# Patient Record
Sex: Female | Born: 1975 | Race: White | Hispanic: No | Marital: Single | Smoking: Current some day smoker
Health system: Southern US, Community
[De-identification: ages and names within clinical notes are randomized; demographics above are authoritative.]

## PROBLEM LIST (undated history)

## (undated) DIAGNOSIS — C801 Malignant (primary) neoplasm, unspecified: Secondary | ICD-10-CM

## (undated) DIAGNOSIS — K5792 Diverticulitis of intestine, part unspecified, without perforation or abscess without bleeding: Secondary | ICD-10-CM

---

## 2004-03-06 ENCOUNTER — Encounter: Admission: RE | Admit: 2004-03-06 | Discharge: 2004-03-06 | Payer: Self-pay | Admitting: Family Medicine

## 2005-04-04 ENCOUNTER — Other Ambulatory Visit: Admission: RE | Admit: 2005-04-04 | Discharge: 2005-04-04 | Payer: Self-pay | Admitting: Family Medicine

## 2008-02-10 ENCOUNTER — Other Ambulatory Visit: Admission: RE | Admit: 2008-02-10 | Discharge: 2008-02-10 | Payer: Self-pay | Admitting: Family Medicine

## 2008-12-27 ENCOUNTER — Encounter: Admission: RE | Admit: 2008-12-27 | Discharge: 2008-12-27 | Payer: Self-pay | Admitting: Family Medicine

## 2009-01-02 ENCOUNTER — Encounter: Payer: Self-pay | Admitting: Family Medicine

## 2010-06-29 ENCOUNTER — Other Ambulatory Visit
Admission: RE | Admit: 2010-06-29 | Discharge: 2010-06-29 | Payer: Self-pay | Source: Home / Self Care | Admitting: Family Medicine

## 2012-08-31 ENCOUNTER — Other Ambulatory Visit (HOSPITAL_COMMUNITY)
Admission: RE | Admit: 2012-08-31 | Discharge: 2012-08-31 | Disposition: A | Discharge status: Home / Self Care | Payer: 59 | Source: Ambulatory Visit | Attending: Family Medicine | Admitting: Family Medicine

## 2012-08-31 ENCOUNTER — Other Ambulatory Visit: Payer: Self-pay | Admitting: Physician Assistant

## 2012-08-31 DIAGNOSIS — Z0000 Encounter for general adult medical examination without abnormal findings: Secondary | ICD-10-CM | POA: Insufficient documentation

## 2016-06-19 DIAGNOSIS — J301 Allergic rhinitis due to pollen: Secondary | ICD-10-CM | POA: Diagnosis not present

## 2016-06-19 DIAGNOSIS — J3081 Allergic rhinitis due to animal (cat) (dog) hair and dander: Secondary | ICD-10-CM | POA: Diagnosis not present

## 2016-06-19 DIAGNOSIS — B370 Candidal stomatitis: Secondary | ICD-10-CM | POA: Diagnosis not present

## 2016-07-03 DIAGNOSIS — J322 Chronic ethmoidal sinusitis: Secondary | ICD-10-CM | POA: Diagnosis not present

## 2016-07-03 DIAGNOSIS — J320 Chronic maxillary sinusitis: Secondary | ICD-10-CM | POA: Diagnosis not present

## 2016-07-03 DIAGNOSIS — J3089 Other allergic rhinitis: Secondary | ICD-10-CM | POA: Diagnosis not present

## 2016-07-29 ENCOUNTER — Other Ambulatory Visit: Payer: Self-pay

## 2016-07-29 DIAGNOSIS — N63 Unspecified lump in unspecified breast: Secondary | ICD-10-CM

## 2016-08-01 DIAGNOSIS — N644 Mastodynia: Secondary | ICD-10-CM | POA: Diagnosis not present

## 2016-08-22 ENCOUNTER — Other Ambulatory Visit: Payer: Self-pay | Admitting: Family Medicine

## 2016-08-22 DIAGNOSIS — N644 Mastodynia: Secondary | ICD-10-CM

## 2016-08-27 ENCOUNTER — Ambulatory Visit
Admission: RE | Admit: 2016-08-27 | Discharge: 2016-08-27 | Disposition: A | Discharge status: Home / Self Care | Payer: 59 | Source: Ambulatory Visit | Attending: Family Medicine | Admitting: Family Medicine

## 2016-08-27 DIAGNOSIS — N644 Mastodynia: Secondary | ICD-10-CM

## 2016-08-27 DIAGNOSIS — R922 Inconclusive mammogram: Secondary | ICD-10-CM | POA: Diagnosis not present

## 2016-08-27 DIAGNOSIS — Z131 Encounter for screening for diabetes mellitus: Secondary | ICD-10-CM | POA: Diagnosis not present

## 2016-08-27 DIAGNOSIS — Z13220 Encounter for screening for lipoid disorders: Secondary | ICD-10-CM | POA: Diagnosis not present

## 2016-08-27 DIAGNOSIS — Z0000 Encounter for general adult medical examination without abnormal findings: Secondary | ICD-10-CM | POA: Diagnosis not present

## 2016-08-27 DIAGNOSIS — Z124 Encounter for screening for malignant neoplasm of cervix: Secondary | ICD-10-CM | POA: Diagnosis not present

## 2016-08-27 DIAGNOSIS — E559 Vitamin D deficiency, unspecified: Secondary | ICD-10-CM | POA: Diagnosis not present

## 2016-10-08 DIAGNOSIS — S30860A Insect bite (nonvenomous) of lower back and pelvis, initial encounter: Secondary | ICD-10-CM | POA: Diagnosis not present

## 2016-10-08 DIAGNOSIS — A084 Viral intestinal infection, unspecified: Secondary | ICD-10-CM | POA: Diagnosis not present

## 2016-12-16 DIAGNOSIS — J029 Acute pharyngitis, unspecified: Secondary | ICD-10-CM | POA: Diagnosis not present

## 2017-03-27 DIAGNOSIS — D225 Melanocytic nevi of trunk: Secondary | ICD-10-CM | POA: Diagnosis not present

## 2017-03-27 DIAGNOSIS — B360 Pityriasis versicolor: Secondary | ICD-10-CM | POA: Diagnosis not present

## 2017-03-27 DIAGNOSIS — D1801 Hemangioma of skin and subcutaneous tissue: Secondary | ICD-10-CM | POA: Diagnosis not present

## 2017-09-02 DIAGNOSIS — Z131 Encounter for screening for diabetes mellitus: Secondary | ICD-10-CM | POA: Diagnosis not present

## 2017-09-02 DIAGNOSIS — R635 Abnormal weight gain: Secondary | ICD-10-CM | POA: Diagnosis not present

## 2017-09-02 DIAGNOSIS — Z0000 Encounter for general adult medical examination without abnormal findings: Secondary | ICD-10-CM | POA: Diagnosis not present

## 2017-09-02 DIAGNOSIS — Z13220 Encounter for screening for lipoid disorders: Secondary | ICD-10-CM | POA: Diagnosis not present

## 2018-01-13 DIAGNOSIS — R635 Abnormal weight gain: Secondary | ICD-10-CM | POA: Diagnosis not present

## 2018-01-20 DIAGNOSIS — R1032 Left lower quadrant pain: Secondary | ICD-10-CM | POA: Diagnosis not present

## 2018-03-16 DIAGNOSIS — Z6835 Body mass index (BMI) 35.0-35.9, adult: Secondary | ICD-10-CM | POA: Diagnosis not present

## 2018-03-19 DIAGNOSIS — D2371 Other benign neoplasm of skin of right lower limb, including hip: Secondary | ICD-10-CM | POA: Diagnosis not present

## 2018-03-19 DIAGNOSIS — M722 Plantar fascial fibromatosis: Secondary | ICD-10-CM | POA: Diagnosis not present

## 2018-04-02 DIAGNOSIS — M79671 Pain in right foot: Secondary | ICD-10-CM | POA: Diagnosis not present

## 2018-04-02 DIAGNOSIS — M722 Plantar fascial fibromatosis: Secondary | ICD-10-CM | POA: Diagnosis not present

## 2018-04-02 DIAGNOSIS — D2371 Other benign neoplasm of skin of right lower limb, including hip: Secondary | ICD-10-CM | POA: Diagnosis not present

## 2018-04-02 DIAGNOSIS — M79672 Pain in left foot: Secondary | ICD-10-CM | POA: Diagnosis not present

## 2018-04-09 DIAGNOSIS — D2371 Other benign neoplasm of skin of right lower limb, including hip: Secondary | ICD-10-CM | POA: Diagnosis not present

## 2018-04-09 DIAGNOSIS — M722 Plantar fascial fibromatosis: Secondary | ICD-10-CM | POA: Diagnosis not present

## 2018-04-09 DIAGNOSIS — M79672 Pain in left foot: Secondary | ICD-10-CM | POA: Diagnosis not present

## 2018-04-24 DIAGNOSIS — D2371 Other benign neoplasm of skin of right lower limb, including hip: Secondary | ICD-10-CM | POA: Diagnosis not present

## 2018-04-24 DIAGNOSIS — M722 Plantar fascial fibromatosis: Secondary | ICD-10-CM | POA: Diagnosis not present

## 2018-04-24 DIAGNOSIS — M79672 Pain in left foot: Secondary | ICD-10-CM | POA: Diagnosis not present

## 2018-05-12 DIAGNOSIS — M722 Plantar fascial fibromatosis: Secondary | ICD-10-CM | POA: Diagnosis not present

## 2018-05-12 DIAGNOSIS — D2371 Other benign neoplasm of skin of right lower limb, including hip: Secondary | ICD-10-CM | POA: Diagnosis not present

## 2018-05-12 DIAGNOSIS — M79672 Pain in left foot: Secondary | ICD-10-CM | POA: Diagnosis not present

## 2018-05-21 DIAGNOSIS — M79671 Pain in right foot: Secondary | ICD-10-CM | POA: Diagnosis not present

## 2018-05-21 DIAGNOSIS — D2371 Other benign neoplasm of skin of right lower limb, including hip: Secondary | ICD-10-CM | POA: Diagnosis not present

## 2018-05-21 DIAGNOSIS — M722 Plantar fascial fibromatosis: Secondary | ICD-10-CM | POA: Diagnosis not present

## 2018-06-04 DIAGNOSIS — D2371 Other benign neoplasm of skin of right lower limb, including hip: Secondary | ICD-10-CM | POA: Diagnosis not present

## 2018-06-04 DIAGNOSIS — M722 Plantar fascial fibromatosis: Secondary | ICD-10-CM | POA: Diagnosis not present

## 2018-07-17 DIAGNOSIS — Z6835 Body mass index (BMI) 35.0-35.9, adult: Secondary | ICD-10-CM | POA: Diagnosis not present

## 2018-11-20 ENCOUNTER — Other Ambulatory Visit: Payer: Self-pay | Admitting: Physician Assistant

## 2018-11-20 DIAGNOSIS — Z1231 Encounter for screening mammogram for malignant neoplasm of breast: Secondary | ICD-10-CM

## 2019-01-06 ENCOUNTER — Other Ambulatory Visit: Payer: Self-pay

## 2019-01-06 ENCOUNTER — Ambulatory Visit
Admission: RE | Admit: 2019-01-06 | Discharge: 2019-01-06 | Disposition: A | Discharge status: Home / Self Care | Payer: 59 | Source: Ambulatory Visit | Attending: Physician Assistant | Admitting: Physician Assistant

## 2019-01-06 DIAGNOSIS — Z1231 Encounter for screening mammogram for malignant neoplasm of breast: Secondary | ICD-10-CM

## 2019-01-08 ENCOUNTER — Other Ambulatory Visit: Payer: Self-pay | Admitting: Physician Assistant

## 2019-01-08 DIAGNOSIS — R928 Other abnormal and inconclusive findings on diagnostic imaging of breast: Secondary | ICD-10-CM

## 2019-01-12 ENCOUNTER — Other Ambulatory Visit: Payer: Self-pay | Admitting: Physician Assistant

## 2019-01-12 ENCOUNTER — Other Ambulatory Visit: Payer: Self-pay

## 2019-01-12 ENCOUNTER — Ambulatory Visit
Admission: RE | Admit: 2019-01-12 | Discharge: 2019-01-12 | Disposition: A | Discharge status: Home / Self Care | Payer: 59 | Source: Ambulatory Visit | Attending: Physician Assistant | Admitting: Physician Assistant

## 2019-01-12 DIAGNOSIS — N6320 Unspecified lump in the left breast, unspecified quadrant: Secondary | ICD-10-CM

## 2019-01-12 DIAGNOSIS — R928 Other abnormal and inconclusive findings on diagnostic imaging of breast: Secondary | ICD-10-CM

## 2019-07-16 ENCOUNTER — Other Ambulatory Visit: Payer: Self-pay

## 2019-07-16 ENCOUNTER — Other Ambulatory Visit: Payer: Self-pay | Admitting: Physician Assistant

## 2019-07-16 ENCOUNTER — Ambulatory Visit
Admission: RE | Admit: 2019-07-16 | Discharge: 2019-07-16 | Disposition: A | Discharge status: Home / Self Care | Payer: 59 | Source: Ambulatory Visit | Attending: Physician Assistant | Admitting: Physician Assistant

## 2019-07-16 DIAGNOSIS — N6320 Unspecified lump in the left breast, unspecified quadrant: Secondary | ICD-10-CM

## 2020-05-06 ENCOUNTER — Emergency Department (HOSPITAL_BASED_OUTPATIENT_CLINIC_OR_DEPARTMENT_OTHER): Payer: 59

## 2020-05-06 ENCOUNTER — Encounter (HOSPITAL_BASED_OUTPATIENT_CLINIC_OR_DEPARTMENT_OTHER): Payer: Self-pay

## 2020-05-06 ENCOUNTER — Other Ambulatory Visit: Payer: Self-pay

## 2020-05-06 ENCOUNTER — Emergency Department (HOSPITAL_BASED_OUTPATIENT_CLINIC_OR_DEPARTMENT_OTHER)
Admission: EM | Admit: 2020-05-06 | Discharge: 2020-05-06 | Disposition: A | Discharge status: Home / Self Care | Payer: 59 | Attending: Emergency Medicine | Admitting: Emergency Medicine

## 2020-05-06 DIAGNOSIS — F17200 Nicotine dependence, unspecified, uncomplicated: Secondary | ICD-10-CM | POA: Diagnosis not present

## 2020-05-06 DIAGNOSIS — U071 COVID-19: Secondary | ICD-10-CM | POA: Diagnosis not present

## 2020-05-06 DIAGNOSIS — R1032 Left lower quadrant pain: Secondary | ICD-10-CM | POA: Diagnosis not present

## 2020-05-06 DIAGNOSIS — R509 Fever, unspecified: Secondary | ICD-10-CM | POA: Diagnosis present

## 2020-05-06 HISTORY — DX: Diverticulitis of intestine, part unspecified, without perforation or abscess without bleeding: K57.92

## 2020-05-06 LAB — URINALYSIS, ROUTINE W REFLEX MICROSCOPIC
Bilirubin Urine: NEGATIVE
Glucose, UA: NEGATIVE mg/dL
Hgb urine dipstick: NEGATIVE
Ketones, ur: 15 mg/dL — AB
Leukocytes,Ua: NEGATIVE
Nitrite: NEGATIVE
Protein, ur: NEGATIVE mg/dL
Specific Gravity, Urine: 1.025 (ref 1.005–1.030)
pH: 7 (ref 5.0–8.0)

## 2020-05-06 LAB — COMPREHENSIVE METABOLIC PANEL
ALT: 26 U/L (ref 0–44)
AST: 26 U/L (ref 15–41)
Albumin: 3.9 g/dL (ref 3.5–5.0)
Alkaline Phosphatase: 53 U/L (ref 38–126)
Anion gap: 9 (ref 5–15)
BUN: 9 mg/dL (ref 6–20)
CO2: 26 mmol/L (ref 22–32)
Calcium: 8.3 mg/dL — ABNORMAL LOW (ref 8.9–10.3)
Chloride: 100 mmol/L (ref 98–111)
Creatinine, Ser: 0.7 mg/dL (ref 0.44–1.00)
GFR, Estimated: >60 mL/min (ref >60)
Glucose, Bld: 101 mg/dL — ABNORMAL HIGH (ref 70–99)
Potassium: 3.6 mmol/L (ref 3.5–5.1)
Sodium: 135 mmol/L (ref 135–145)
Total Bilirubin: 0.2 mg/dL — ABNORMAL LOW (ref 0.3–1.2)
Total Protein: 7.6 g/dL (ref 6.5–8.1)

## 2020-05-06 LAB — CBC WITH DIFFERENTIAL/PLATELET
Abs Immature Granulocytes: 0.01 10*3/uL (ref 0.00–0.07)
Basophils Absolute: 0 10*3/uL (ref 0.0–0.1)
Basophils Relative: 0 %
Eosinophils Absolute: 0 10*3/uL (ref 0.0–0.5)
Eosinophils Relative: 0 %
HCT: 38.9 % (ref 36.0–46.0)
Hemoglobin: 12.2 g/dL (ref 12.0–15.0)
Immature Granulocytes: 0 %
Lymphocytes Relative: 11 %
Lymphs Abs: 0.7 10*3/uL (ref 0.7–4.0)
MCH: 25.1 pg — ABNORMAL LOW (ref 26.0–34.0)
MCHC: 31.4 g/dL (ref 30.0–36.0)
MCV: 80 fL (ref 80.0–100.0)
Monocytes Absolute: 0.3 10*3/uL (ref 0.1–1.0)
Monocytes Relative: 5 %
Neutro Abs: 4.8 10*3/uL (ref 1.7–7.7)
Neutrophils Relative %: 84 %
Platelets: 205 10*3/uL (ref 150–400)
RBC: 4.86 MIL/uL (ref 3.87–5.11)
RDW: 15.8 % — ABNORMAL HIGH (ref 11.5–15.5)
WBC: 5.8 10*3/uL (ref 4.0–10.5)
nRBC: 0 % (ref 0.0–0.2)

## 2020-05-06 LAB — LIPASE, BLOOD: Lipase: 34 U/L (ref 11–51)

## 2020-05-06 LAB — PREGNANCY, URINE: Preg Test, Ur: NEGATIVE

## 2020-05-06 MED ORDER — DICYCLOMINE HCL 20 MG PO TABS: 20.0000 mg | ORAL_TABLET | Freq: Two times a day (BID) | ORAL | 0 refills | Status: DC

## 2020-05-06 MED ORDER — IOHEXOL 300 MG/ML  SOLN
100.0000 mL | Freq: Once | INTRAMUSCULAR | Status: AC | PRN
  Administered 2020-05-06: 100 mL via INTRAVENOUS

## 2020-05-06 MED ORDER — ACETAMINOPHEN 325 MG PO TABS
650.0000 mg | ORAL_TABLET | Freq: Once | ORAL | Status: AC
  Administered 2020-05-06: 650 mg via ORAL
  Filled 2020-05-06: qty 2

## 2020-05-06 MED ORDER — ONDANSETRON 4 MG PO TBDP: 4.0000 mg | ORAL_TABLET | Freq: Three times a day (TID) | ORAL | 0 refills | Status: DC | PRN

## 2020-05-06 MED ORDER — DICYCLOMINE HCL 10 MG PO CAPS
20.0000 mg | ORAL_CAPSULE | Freq: Once | ORAL | Status: AC
  Administered 2020-05-06: 20 mg via ORAL
  Filled 2020-05-06: qty 2

## 2020-05-06 MED ORDER — KETOROLAC TROMETHAMINE 15 MG/ML IJ SOLN
15.0000 mg | Freq: Once | INTRAMUSCULAR | Status: AC
  Administered 2020-05-06: 15 mg via INTRAVENOUS
  Filled 2020-05-06: qty 1

## 2020-05-06 MED ORDER — SODIUM CHLORIDE 0.9 % IV SOLN: Freq: Once | INTRAVENOUS | Status: AC

## 2020-05-06 MED ORDER — LORAZEPAM 1 MG PO TABS
0.5000 mg | ORAL_TABLET | Freq: Once | ORAL | Status: AC
  Administered 2020-05-06: 0.5 mg via ORAL
  Filled 2020-05-06: qty 1

## 2020-05-06 NOTE — Discharge Instructions (Addendum)
You have Covid which is an inflammatory illness which can cause fevers and chills abdominal pain nausea vomiting diarrhea.  As well as other symptoms.  Please take Tylenol and ibuprofen as discussed below this we will treat your pain as well as your fever.  I have also prescribed you Bentyl which is a pain medicine for abdominal pain.  Also Zofran which is a nausea medicine.  Please call to [facility] to schedule a follow-up appointment.  You may always return to the emergency department for any new or concerning symptoms.  As we discussed my primary concern right now is about your anxiety which can cause more suffering during an illness which your body should clear on its own.  Please use Tylenol or ibuprofen for pain.  You may use 600 mg ibuprofen every 6 hours or 1000 mg of Tylenol every 6 hours.  You may choose to alternate between the 2.  This would be most effective.  Not to exceed 4 g of Tylenol within 24 hours.  Not to exceed 3200 mg ibuprofen 24 hours.

## 2020-05-06 NOTE — ED Triage Notes (Signed)
Pt reports lower abdominal pain since last night. Pt states she had diverticulitis in 2005 and it feels similar today. Pt states she had a rapid and an at home test done for covid and they were positive. Pt states she has had a low grade fever off and on since Tuesday. Pt denies n/v/d or shortness of breath.

## 2020-05-06 NOTE — ED Notes (Signed)
CT awaiting results of pregnancy test prior to imaging, per [facility] protocol

## 2020-05-06 NOTE — ED Provider Notes (Signed)
[facility] Provider Note   CSN: [id number] Arrival date & time: 05/06/20  1453     History Chief Complaint  Patient presents with  . Abdominal Pain    Samantha [name] is a 44 y.o. female.  HPI Patient is a 45 year old female with a history of diverticulitis she states that she has had 2 episodes in the past.   Patient is on day 4 of symptoms of Covid.  She tested positive at urgent care.  She states she has had intermittent fevers has not been taking Tylenol or ibuprofen for her fevers.  She states she has had no cough or shortness of breath.  She states that starting last night she has had crampy achy severe lower abdominal pain on the left side.  She states it is present more than it is absent but seems to be coming ongoing and waxing and waning.  She states she has had 2 episodes of diverticulitis in the past and it feels similar.  She denies any diarrhea or blood in her stool either melanotic or bright red.  She denies any nausea, vomiting, shortness of breath, dizziness.  She states that her T-max was 103 in the night.  No other significant associated symptoms.  She denies any vaginal discharge, frequency or urgency.  No hematuria no other associated symptoms.  She is ago medications prior to arrival for symptoms.     Past Medical History:  Diagnosis Date  . Diverticulitis     There are no problems to display for this patient.   History reviewed. No pertinent surgical history.   OB History   No obstetric history on file.     History reviewed. No pertinent family history.  Social History   Tobacco Use  . Smoking status: Current Some Day Smoker  . Smokeless tobacco: Never Used  Substance Use Topics  . Alcohol use: Yes    Comment: occ  . Drug use: Never    Home Medications Prior to Admission medications   Medication Sig Start Date End Date Taking? Authorizing Provider  dicyclomine (BENTYL) 20 MG tablet Take 1 tablet (20 mg  total) by mouth 2 (two) times daily. 05/06/20   Asuka Dusseau, Kathleene Hazel, PA  ondansetron (ZOFRAN ODT) 4 MG disintegrating tablet Take 1 tablet (4 mg total) by mouth every 8 (eight) hours as needed for nausea or vomiting. 05/06/20   Tedd Sias, PA    Allergies    Patient has no known allergies.  Review of Systems   Review of Systems  Constitutional: Positive for fatigue and fever.  HENT: Negative for congestion.   Respiratory: Negative for shortness of breath.   Cardiovascular: Negative for chest pain.  Gastrointestinal: Positive for abdominal pain. Negative for abdominal distention, diarrhea, nausea and vomiting.  Neurological: Negative for dizziness and headaches.    Physical Exam Updated Vital Signs BP (!) 131/53 (BP Location: Right Arm)   Pulse 92   Temp 100 F (37.8 C) (Oral)   Resp 20   Ht 5\' 4"  (1.626 m)   Wt 95.3 kg   LMP 04/23/2020   SpO2 96%   BMI 36.05 kg/m   Physical Exam Vitals and nursing note reviewed.  Constitutional:      General: She is not in acute distress. HENT:     Head: Normocephalic and atraumatic.     Nose: Nose normal.  Eyes:     General: No scleral icterus. Cardiovascular:     Rate and Rhythm: Normal rate and  regular rhythm.     Pulses: Normal pulses.     Heart sounds: Normal heart sounds.  Pulmonary:     Effort: Pulmonary effort is normal. No respiratory distress.     Breath sounds: Normal breath sounds. No wheezing.  Abdominal:     Palpations: Abdomen is soft.     Tenderness: There is abdominal tenderness.     Comments: Palpation of the left lower quadrant. No involuntary guarding or rebound.  There is voluntary guarding of the left lower quadrant.  Genitourinary:    Comments: Deferred Musculoskeletal:     Cervical back: Normal range of motion.     Right lower leg: No edema.     Left lower leg: No edema.  Skin:    General: Skin is warm and dry.     Capillary Refill: Capillary refill takes less than 2 seconds.  Neurological:      Mental Status: She is alert. Mental status is at baseline.  Psychiatric:        Mood and Affect: Mood normal.        Behavior: Behavior normal.     ED Results / Procedures / Treatments   Labs (all labs ordered are listed, but only abnormal results are displayed) Labs Reviewed  CBC WITH DIFFERENTIAL/PLATELET - Abnormal; Notable for the following components:      Result Value   MCH 25.1 (*)    RDW 15.8 (*)    All other components within normal limits  COMPREHENSIVE METABOLIC PANEL - Abnormal; Notable for the following components:   Glucose, Bld 101 (*)    Calcium 8.3 (*)    Total Bilirubin 0.2 (*)    All other components within normal limits  URINALYSIS, ROUTINE W REFLEX MICROSCOPIC - Abnormal; Notable for the following components:   Ketones, ur 15 (*)    All other components within normal limits  LIPASE, BLOOD  PREGNANCY, URINE    EKG None  Radiology CT ABDOMEN PELVIS W CONTRAST  Result Date: 05/06/2020 CLINICAL DATA:  Left lower quadrant abdominal pain. EXAM: CT ABDOMEN AND PELVIS WITH CONTRAST TECHNIQUE: Multidetector CT imaging of the abdomen and pelvis was performed using the standard protocol following bolus administration of intravenous contrast. CONTRAST:  172mL OMNIPAQUE IOHEXOL 300 MG/ML  SOLN COMPARISON:  None. FINDINGS: Lower chest: Small subpleural nodular areas of ground-glass attenuation in bilateral lung bases, right greater than left. Hepatobiliary: No focal liver abnormality is seen. 2 cm gallstone. No gallbladder wall thickening, or biliary dilatation. Pancreas: Unremarkable. No pancreatic ductal dilatation or surrounding inflammatory changes. Spleen: Normal in size without focal abnormality. Adrenals/Urinary Tract: Adrenal glands are unremarkable. Kidneys are normal, without renal calculi, focal lesion, or hydronephrosis. Bladder is unremarkable. Stomach/Bowel: Normal appearance of the stomach and small bowel. Normal appendix. Diffuse colonic diverticulosis.  Eccentric mucosal thickening, and marked pericolonic inflammatory changes surround the proximal sigmoid colon, likely related to acute diverticulitis. No evidence of abscess formation. Vascular/Lymphatic: No significant vascular findings are present. No enlarged abdominal or pelvic lymph nodes. Reproductive: Uterus and bilateral adnexa are unremarkable. Other: No abdominal wall hernia or abnormality. No abdominopelvic ascites. Musculoskeletal: No acute or significant osseous findings. IMPRESSION: 1. Diffuse colonic diverticulosis with evidence of acute diverticulitis of the proximal sigmoid colon. No evidence of abscess formation. 2. Cholelithiasis without evidence of acute cholecystitis. 3. Small subpleural nodular areas of ground-glass attenuation in bilateral lung bases, right greater than left. These may represent early COVID-19 pneumonia. Consider follow-up after resolution of patient's symptoms. Electronically Signed   By: Thomas Hoff  Dimitrova M.D.   On: 05/06/2020 19:58    Procedures Procedures (including critical care time)  Medications Ordered in ED Medications  0.9 %  sodium chloride infusion ( Intravenous Stopped 05/06/20 1828)  acetaminophen (TYLENOL) tablet 650 mg (650 mg Oral Given 05/06/20 1647)  LORazepam (ATIVAN) tablet 0.5 mg (0.5 mg Oral Given 05/06/20 1648)  iohexol (OMNIPAQUE) 300 MG/ML solution 100 mL (100 mLs Intravenous Contrast Given 05/06/20 1929)  dicyclomine (BENTYL) capsule 20 mg (20 mg Oral Given 05/06/20 2117)  ketorolac (TORADOL) 15 MG/ML injection 15 mg (15 mg Intravenous Given 05/06/20 2112)    ED Course  I have reviewed the triage vital signs and the nursing notes.  Pertinent labs & imaging results that were available during my care of the patient were reviewed by me and considered in my medical decision making (see chart for details).    MDM Rules/Calculators/A&P                          Patient with history of diverticulitis presented today with fever  which may be due to her current Covid infection, left lower quad abdominal pain.   Feels her high fevers and history of diverticulitis and left lower quadrant abdominal pain I do have some concern for diverticulitis with possible complication.  Will obtain CT imaging.  Her physical exam is notable for left lower quadrant abdominal tenderness.  She is not tachycardic but she is significantly febrile.  Will provide patient with Tylenol and fluids.  She is also quite anxious.  Will provide patient with half tablet of Ativan.  CT imaging shows no acute abnormality of the abdomen or pelvis.  There is no evidence of diverticulitis although she does have diffuse colonic diverticulosis.  We will discharge patient with Tylenol ibuprofen recommendations she was given 1 dose  of Toradol prior to discharge.  She says she feels significantly better and her fever has improved with Tylenol.  Suspect her symptoms are due to Covid.  Given patient's obesity will provide her with MAB infusion referral.  Will follow up with pulmonary clinic.  Discharged with Bentyl and Zofran.  Final Clinical Impression(s) / ED Diagnoses Final diagnoses:  Left lower quadrant abdominal pain  COVID-19    Rx / DC Orders ED Discharge Orders         Ordered    ondansetron (ZOFRAN ODT) 4 MG disintegrating tablet  Every 8 hours PRN        05/06/20 2057    dicyclomine (BENTYL) 20 MG tablet  2 times daily        05/06/20 2057           Tedd Sias, [place] 05/07/20 0148    Rex Kras Wenda Overland, MD 05/07/20 1537

## 2020-05-07 ENCOUNTER — Telehealth: Payer: Self-pay | Admitting: Physician Assistant

## 2020-05-07 NOTE — Telephone Encounter (Signed)
Called to Discuss with patient about Covid symptoms and the use of the monoclonal antibody infusion for those with mild to moderate Covid symptoms and at a high risk of hospitalization.     Pt appears to qualify for this infusion due to co-morbid conditions and/or a member of an at-risk group in accordance with the FDA Emergency Use Authorization.    Unable to reach pt. Voice mail is full.

## 2020-05-08 ENCOUNTER — Telehealth: Payer: Self-pay | Admitting: Physician Assistant

## 2020-05-08 ENCOUNTER — Other Ambulatory Visit: Payer: Self-pay | Admitting: Physician Assistant

## 2020-05-08 DIAGNOSIS — E669 Obesity, unspecified: Secondary | ICD-10-CM

## 2020-05-08 DIAGNOSIS — U071 COVID-19: Secondary | ICD-10-CM

## 2020-05-08 NOTE — Progress Notes (Signed)
I connected by phone with Samantha [name] on 05/08/2020 at 1:13 PM to discuss the potential use of a new treatment for mild to moderate COVID-19 viral infection in non-hospitalized patients.  This patient is a 44 y.o. female that meets the FDA criteria for Emergency Use Authorization of COVID monoclonal antibody casirivimab/imdevimab, bamlanivimab/eteseviamb, or sotrovimab.  Has a (+) direct SARS-CoV-2 viral test result  Has mild or moderate COVID-19   Is NOT hospitalized due to COVID-19  Is within 10 days of symptom onset  Has at least one of the high risk factor(s) for progression to severe COVID-19 and/or hospitalization as defined in EUA.  Specific high risk criteria : BMI > 25   I have spoken and communicated the following to the patient or parent/caregiver regarding COVID monoclonal antibody treatment:  1. FDA has authorized the emergency use for the treatment of mild to moderate COVID-19 in adults and pediatric patients with positive results of direct SARS-CoV-2 viral testing who are 9 years of age and older weighing at least 40 kg, and who are at high risk for progressing to severe COVID-19 and/or hospitalization.  2. The significant known and potential risks and benefits of COVID monoclonal antibody, and the extent to which such potential risks and benefits are unknown.  3. Information on available alternative treatments and the risks and benefits of those alternatives, including clinical trials.  4. Patients treated with COVID monoclonal antibody should continue to self-isolate and use infection control measures (e.g., wear mask, isolate, social distance, avoid sharing personal items, clean and disinfect "high touch" surfaces, and frequent handwashing) according to CDC guidelines.   5. The patient or parent/caregiver has the option to accept or refuse COVID monoclonal antibody treatment.  After reviewing this information with the patient, the patient has agreed to receive one  of the available covid 19 monoclonal antibodies and will be provided an appropriate fact sheet prior to infusion. [place], [place] 05/08/2020 1:13 PM

## 2020-05-08 NOTE — Telephone Encounter (Signed)
Pt returning call

## 2020-05-08 NOTE — Telephone Encounter (Signed)
Follow up:    Patient has called a few time this morning and no one has called her back. Please call patient back.

## 2020-05-08 NOTE — Telephone Encounter (Signed)
Patient calling again to return Samantha [name]'s phone call.

## 2020-05-09 ENCOUNTER — Ambulatory Visit (HOSPITAL_COMMUNITY)
Admission: RE | Admit: 2020-05-09 | Discharge: 2020-05-09 | Disposition: A | Discharge status: Home / Self Care | Payer: 59 | Source: Ambulatory Visit | Attending: Pulmonary Disease | Admitting: Pulmonary Disease

## 2020-05-09 DIAGNOSIS — Z6839 Body mass index (BMI) 39.0-39.9, adult: Secondary | ICD-10-CM | POA: Diagnosis not present

## 2020-05-09 DIAGNOSIS — U071 COVID-19: Secondary | ICD-10-CM | POA: Insufficient documentation

## 2020-05-09 DIAGNOSIS — E669 Obesity, unspecified: Secondary | ICD-10-CM | POA: Diagnosis not present

## 2020-05-09 MED ORDER — METHYLPREDNISOLONE SODIUM SUCC 125 MG IJ SOLR: 125.0000 mg | Freq: Once | INTRAMUSCULAR | Status: DC | PRN

## 2020-05-09 MED ORDER — FAMOTIDINE IN NACL 20-0.9 MG/50ML-% IV SOLN: 20.0000 mg | Freq: Once | INTRAVENOUS | Status: DC | PRN

## 2020-05-09 MED ORDER — EPINEPHRINE 0.3 MG/0.3ML IJ SOAJ: 0.3000 mg | Freq: Once | INTRAMUSCULAR | Status: DC | PRN

## 2020-05-09 MED ORDER — SOTROVIMAB 500 MG/8ML IV SOLN
500.0000 mg | Freq: Once | INTRAVENOUS | Status: AC
  Administered 2020-05-09: 500 mg via INTRAVENOUS

## 2020-05-09 MED ORDER — ALBUTEROL SULFATE HFA 108 (90 BASE) MCG/ACT IN AERS: 2.0000 | INHALATION_SPRAY | Freq: Once | RESPIRATORY_TRACT | Status: DC | PRN

## 2020-05-09 MED ORDER — DIPHENHYDRAMINE HCL 50 MG/ML IJ SOLN: 50.0000 mg | Freq: Once | INTRAMUSCULAR | Status: DC | PRN

## 2020-05-09 MED ORDER — SODIUM CHLORIDE 0.9 % IV SOLN: INTRAVENOUS | Status: DC | PRN

## 2020-05-09 NOTE — Discharge Instructions (Signed)
10 Things You Can Do to Manage Your COVID-19 Symptoms at Home If you have possible or confirmed COVID-19: 1. Stay home from work and school. And stay away from other public places. If you must go out, avoid using any kind of public transportation, ridesharing, or taxis. 2. Monitor your symptoms carefully. If your symptoms get worse, call your healthcare provider immediately. 3. Get rest and stay hydrated. 4. If you have a medical appointment, call the healthcare provider ahead of time and tell them that you have or may have COVID-19. 5. For medical emergencies, call 911 and notify the dispatch personnel that you have or may have COVID-19. 6. Cover your cough and sneezes with a tissue or use the inside of your elbow. 7. Wash your hands often with soap and water for at least 20 seconds or clean your hands with an alcohol-based hand sanitizer that contains at least 60% alcohol. 8. As much as possible, stay in a specific room and away from other people in your home. Also, you should use a separate bathroom, if available. If you need to be around other people in or outside of the home, wear a mask. 9. Avoid sharing personal items with other people in your household, like dishes, towels, and bedding. 10. Clean all surfaces that are touched often, like counters, tabletops, and doorknobs. Use household cleaning sprays or wipes according to the label instructions. cdc.gov/coronavirus 12/09/2018 This information is not intended to replace advice given to you by your health care provider. Make sure you discuss any questions you have with your health care provider. Document Revised: 05/13/2019 Document Reviewed: 05/13/2019 Elsevier Patient Education  2020 Elsevier Inc.  What types of side effects do monoclonal antibody drugs cause?  Common side effects  In general, the more common side effects caused by monoclonal antibody drugs include: . Allergic reactions, such as hives or itching . Flu-like signs and  symptoms, including chills, fatigue, fever, and muscle aches and pains . Nausea, vomiting . Diarrhea . Skin rashes . Low blood pressure   The CDC is recommending patients who receive monoclonal antibody treatments wait at least 90 days before being vaccinated.  Currently, there are no data on the safety and efficacy of mRNA COVID-19 vaccines in persons who received monoclonal antibodies or convalescent plasma as part of COVID-19 treatment. Based on the estimated half-life of such therapies as well as evidence suggesting that reinfection is uncommon in the 90 days after initial infection, vaccination should be deferred for at least 90 days, as a precautionary measure until additional information becomes available, to avoid interference of the antibody treatment with vaccine-induced immune responses.  If you have any questions or concerns after the infusion please call the Advanced Practice Provider on call at [phone]. This number is only intended for your use regarding questions or concerns about the infusion post-treatment side-effects.  Please do not provide this number to others for use.   If someone you know is interested in receiving treatment please have them call the COVID hotline at [phone].   

## 2020-05-09 NOTE — Progress Notes (Signed)
Diagnosis: COVID-19  Physician: Dr. Patrick Wright  Procedure: Covid Infusion Clinic Med: Sotrovimab infusion - Provided patient with sotrovimab fact sheet for patients, parents, and caregivers prior to infusion.   Complications: No immediate complications noted  Discharge: Discharged home    

## 2020-05-09 NOTE — Progress Notes (Signed)
Patient reviewed Fact Sheet for Patients, Parents, and Caregivers for Emergency Use Authorization (EUA) of Sotrovimab for the Treatment of Coronavirus. Patient also reviewed and is agreeable to the estimated cost of treatment. Patient is agreeable to proceed.   

## 2020-05-19 ENCOUNTER — Telehealth: Payer: Self-pay | Admitting: Physician Assistant

## 2020-05-19 NOTE — Telephone Encounter (Signed)
Patient is calling to schedule her post covid care appt. Please advise CB- [phone]

## 2020-05-22 NOTE — Telephone Encounter (Signed)
Please call patient to schedule appointment. Thanks.

## 2020-05-23 ENCOUNTER — Other Ambulatory Visit: Payer: Self-pay

## 2020-05-23 ENCOUNTER — Ambulatory Visit (INDEPENDENT_AMBULATORY_CARE_PROVIDER_SITE_OTHER): Payer: 59 | Admitting: Nurse Practitioner

## 2020-05-23 ENCOUNTER — Ambulatory Visit
Admission: RE | Admit: 2020-05-23 | Discharge: 2020-05-23 | Disposition: A | Discharge status: Home / Self Care | Payer: 59 | Source: Ambulatory Visit | Attending: Nurse Practitioner | Admitting: Nurse Practitioner

## 2020-05-23 VITALS — BP 116/82 | HR 80 | Temp 97.1°F | Ht 64.0 in | Wt 210.0 lb

## 2020-05-23 DIAGNOSIS — J1282 Pneumonia due to coronavirus disease 2019: Secondary | ICD-10-CM | POA: Diagnosis not present

## 2020-05-23 DIAGNOSIS — R059 Cough, unspecified: Secondary | ICD-10-CM | POA: Insufficient documentation

## 2020-05-23 DIAGNOSIS — U071 COVID-19: Secondary | ICD-10-CM | POA: Insufficient documentation

## 2020-05-23 NOTE — Assessment & Plan Note (Signed)
Cough:   Stay well hydrated  Stay active  Deep breathing exercises  May take tylenol for fever or pain  May take Delsym twice daily  Will order chest x ray   Follow up:  Follow up if needed pending xray results

## 2020-05-23 NOTE — Progress Notes (Signed)
@Patient  ID: Samantha [name], female    DOB: 09/06/1975, 44 y.o.   MRN: [id number]  Chief Complaint  Patient presents with  . New Patient (Initial Visit)    COVID 11/24, received infusion 11/30 Sx: slight cough with little chest pressure.     Referring provider: No ref. provider found   44 year old female current smoker with past history of diverticulitis.  HPI  Patient presents today for post COVID care clinic visit/ED follow-up.  Patient was seen in the ED on 05/06/2020.  She was seen for abdominal pain.  Upon CT scan results showed diffuse colonic diverticulosis and acute diverticulitis.  ED physician's note stated that his CT imaging showed no abnormality of the abdomen or pelvis there is no evidence of diverticulitis although she may have diffuse colonic diverticulosis.  CT scan also showed Covid pneumonia to bilateral lung bases.  Patient did receive monoclonal antibody infusion.  Patient was sent home with Tylenol.  ED physician felt that symptoms were related to COVID rather than diverticulitis.  Patient states that she is much improved since ED discharge.  She has been fever free for several days now.  She does complain of ongoing slight cough with a little burning in her chest.  She states that using 1 Tylenol helps relieve the burning. Denies f/c/s, n/v/d, hemoptysis, PND, chest pain or edema.       No Known Allergies   There is no immunization history on file for this patient.  Past Medical History:  Diagnosis Date  . Diverticulitis     Tobacco History: Social History   Tobacco Use  Smoking Status Current Some Day Smoker  Smokeless Tobacco Never Used   Ready to quit: No Counseling given: Yes   Outpatient Encounter Medications as of 05/23/2020  Medication Sig  . dicyclomine (BENTYL) 20 MG tablet Take 1 tablet (20 mg total) by mouth 2 (two) times daily. (Patient not taking: Reported on 05/23/2020)  . ondansetron (ZOFRAN ODT) 4 MG disintegrating tablet Take 1  tablet (4 mg total) by mouth every 8 (eight) hours as needed for nausea or vomiting. (Patient not taking: Reported on 05/23/2020)   No facility-administered encounter medications on file as of 05/23/2020.     Review of Systems  Review of Systems  Constitutional: Negative.  Negative for fatigue and fever.  HENT: Negative.   Respiratory: Positive for cough. Negative for shortness of breath.   Cardiovascular: Negative.  Negative for chest pain, palpitations and leg swelling.  Gastrointestinal: Negative.   Allergic/Immunologic: Negative.   Neurological: Negative.   Psychiatric/Behavioral: Negative.        Physical Exam  BP 116/82 (BP Location: Left Arm)   Pulse 80   Temp (!) 97.1 F (36.2 C)   Ht 5\' 4"  (1.626 m)   Wt 210 lb (95.3 kg)   LMP 04/23/2020   SpO2 99%   BMI 36.05 kg/m   Wt Readings from Last 5 Encounters:  05/23/20 210 lb (95.3 kg)  05/06/20 210 lb (95.3 kg)     Physical Exam Vitals and nursing note reviewed.  Constitutional:      General: She is not in acute distress.    Appearance: She is well-developed and well-nourished.  Cardiovascular:     Rate and Rhythm: Normal rate and regular rhythm.  Pulmonary:     Effort: Pulmonary effort is normal.     Breath sounds: Normal breath sounds.  Neurological:     Mental Status: She is alert and oriented to person, place, and time.  Psychiatric:        Mood and Affect: Mood and affect and mood normal.        Behavior: Behavior normal.       Imaging: CT ABDOMEN PELVIS W CONTRAST  Result Date: 05/06/2020 CLINICAL DATA:  Left lower quadrant abdominal pain. EXAM: CT ABDOMEN AND PELVIS WITH CONTRAST TECHNIQUE: Multidetector CT imaging of the abdomen and pelvis was performed using the standard protocol following bolus administration of intravenous contrast. CONTRAST:  166mL OMNIPAQUE IOHEXOL 300 MG/ML  SOLN COMPARISON:  None. FINDINGS: Lower chest: Small subpleural nodular areas of ground-glass attenuation in  bilateral lung bases, right greater than left. Hepatobiliary: No focal liver abnormality is seen. 2 cm gallstone. No gallbladder wall thickening, or biliary dilatation. Pancreas: Unremarkable. No pancreatic ductal dilatation or surrounding inflammatory changes. Spleen: Normal in size without focal abnormality. Adrenals/Urinary Tract: Adrenal glands are unremarkable. Kidneys are normal, without renal calculi, focal lesion, or hydronephrosis. Bladder is unremarkable. Stomach/Bowel: Normal appearance of the stomach and small bowel. Normal appendix. Diffuse colonic diverticulosis. Eccentric mucosal thickening, and marked pericolonic inflammatory changes surround the proximal sigmoid colon, likely related to acute diverticulitis. No evidence of abscess formation. Vascular/Lymphatic: No significant vascular findings are present. No enlarged abdominal or pelvic lymph nodes. Reproductive: Uterus and bilateral adnexa are unremarkable. Other: No abdominal wall hernia or abnormality. No abdominopelvic ascites. Musculoskeletal: No acute or significant osseous findings. IMPRESSION: 1. Diffuse colonic diverticulosis with evidence of acute diverticulitis of the proximal sigmoid colon. No evidence of abscess formation. 2. Cholelithiasis without evidence of acute cholecystitis. 3. Small subpleural nodular areas of ground-glass attenuation in bilateral lung bases, right greater than left. These may represent early COVID-19 pneumonia. Consider follow-up after resolution of patient's symptoms. Electronically Signed   By: Fidela Salisbury M.D.   On: 05/06/2020 19:58     Assessment & Plan:   Pneumonia due to COVID-19 virus Cough:   Stay well hydrated  Stay active  Deep breathing exercises  May take tylenol for fever or pain  May take Delsym twice daily  Will order chest x ray   Follow up:  Follow up if needed pending xray results      Fenton Foy, NP 05/23/2020

## 2020-05-23 NOTE — Patient Instructions (Addendum)
Covid 19 Cough:   Stay well hydrated  Stay active  Deep breathing exercises  May take tylenol for fever or pain  May take Delsym twice daily  Will order chest x ray   Follow up:  Follow up if needed pending xray results

## 2020-05-24 ENCOUNTER — Telehealth: Payer: Self-pay | Admitting: Nurse Practitioner

## 2020-05-24 NOTE — Telephone Encounter (Signed)
-----   Message from Fenton Foy, NP sent at 05/24/2020  8:51 AM EST ----- Please call to let patient know that her chest xray was clear.

## 2020-05-24 NOTE — Telephone Encounter (Signed)
Patient notified of results, verbally understood. No additional questions.

## 2020-12-25 IMAGING — CT CT ABD-PELV W/ CM
2 of 5 series · 16 of 46 positions shown, 18 images · IV contrast (Omnipaque)
Comparison: None.

CLINICAL DATA: Left lower quadrant abdominal pain.

EXAM:
CT ABDOMEN AND PELVIS WITH CONTRAST
TECHNIQUE: Multidetector CT imaging of the abdomen and pelvis was performed
using the standard protocol following bolus administration of
intravenous contrast.
CONTRAST:  100mL OMNIPAQUE IOHEXOL 300 MG/ML  SOLN

[Series 2: axial st · axial · 0.78mm/px · z∈[-532,-117]mm · 13 of 93 slices shown, 15 images]
[im 5/93  soft-tissue]
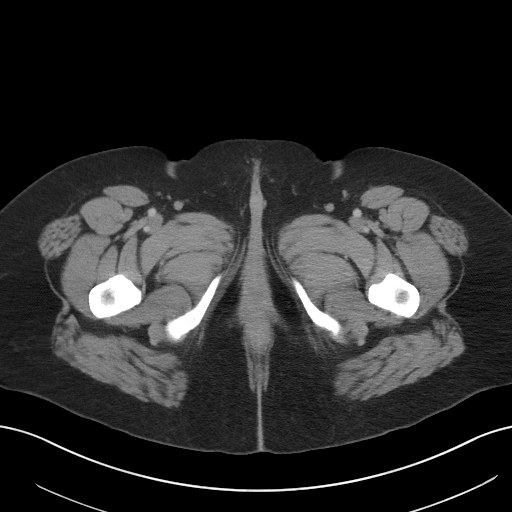
[im 5/93  bone]
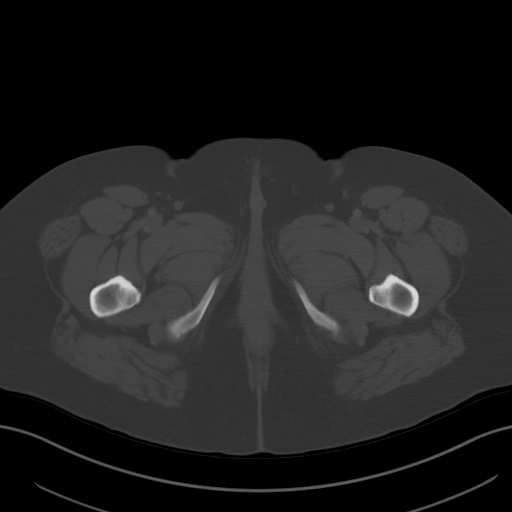
[im 14/93  soft-tissue]
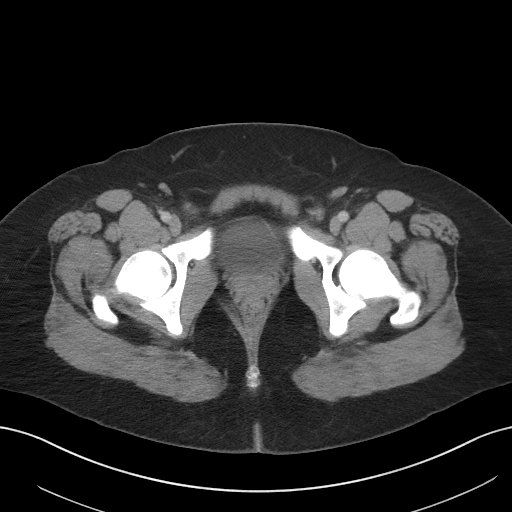
[im 19/93  soft-tissue]
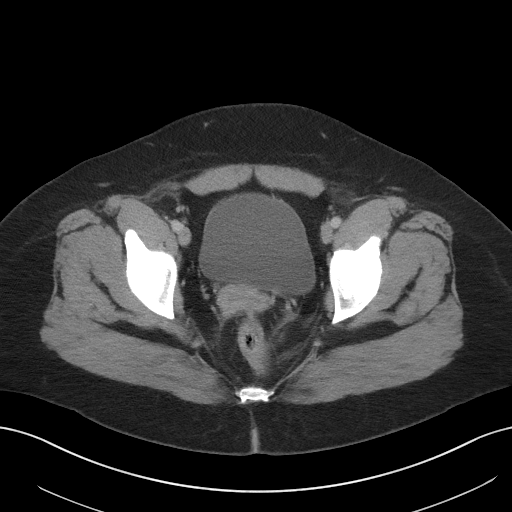
[im 28/93  soft-tissue]
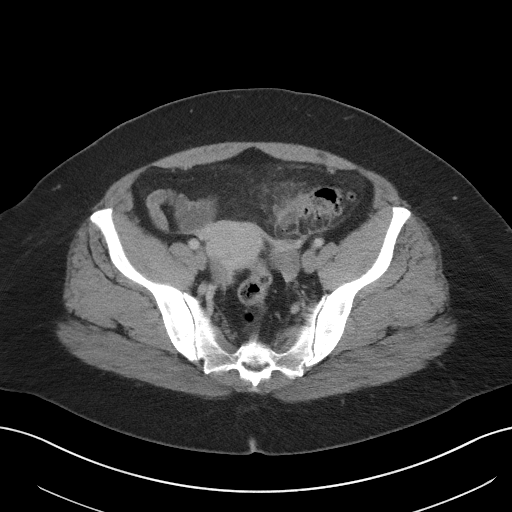
[im 33/93  soft-tissue]
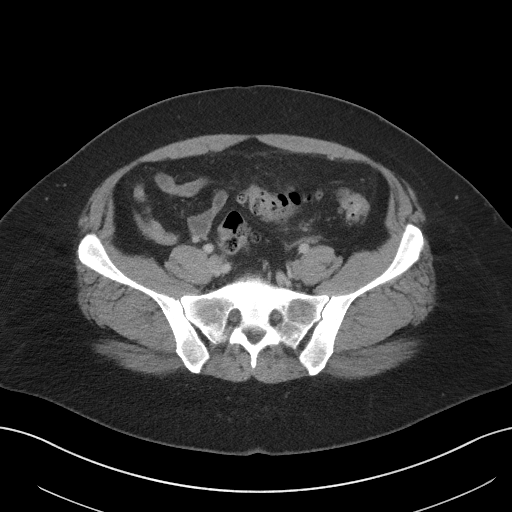
[im 42/93  soft-tissue]
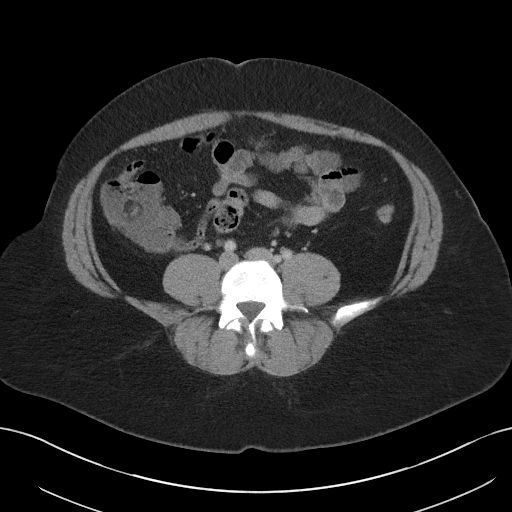
[im 47/93  soft-tissue]
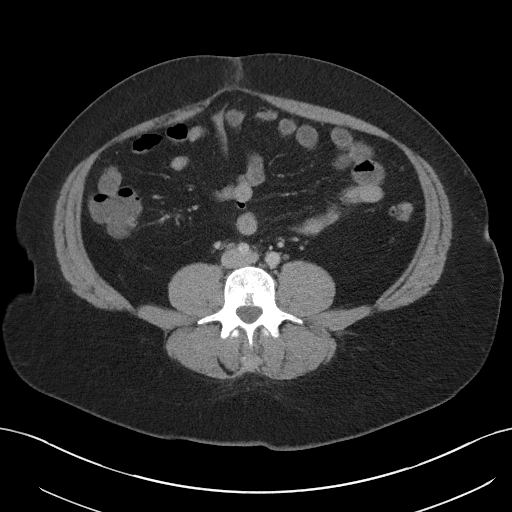
[im 51/93  soft-tissue]
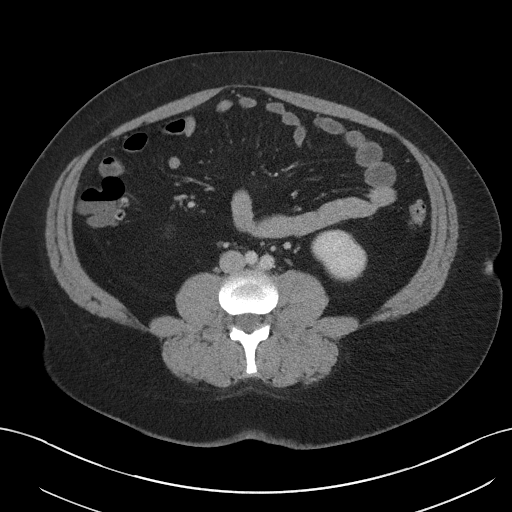
[im 60/93  soft-tissue]
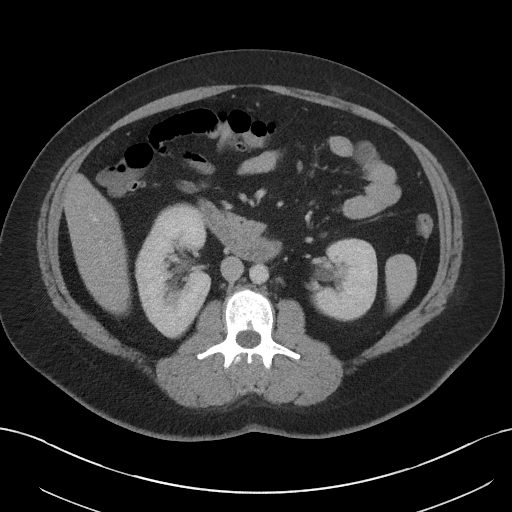
[im 60/93  bone]
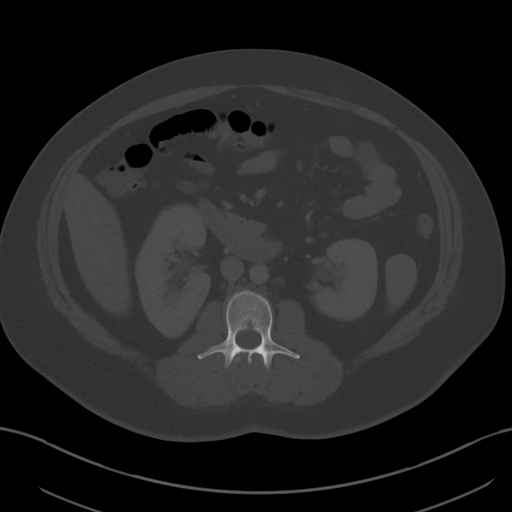
[im 65/93  soft-tissue]
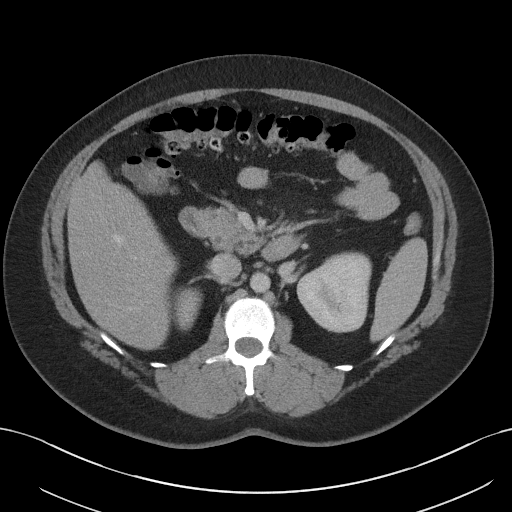
[im 74/93  soft-tissue]
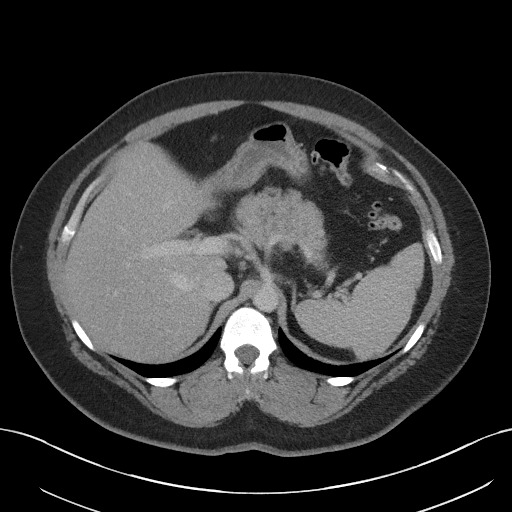
[im 79/93  soft-tissue]
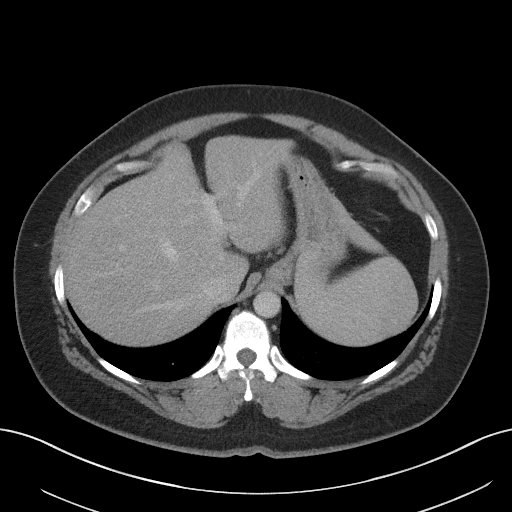
[im 88/93  soft-tissue]
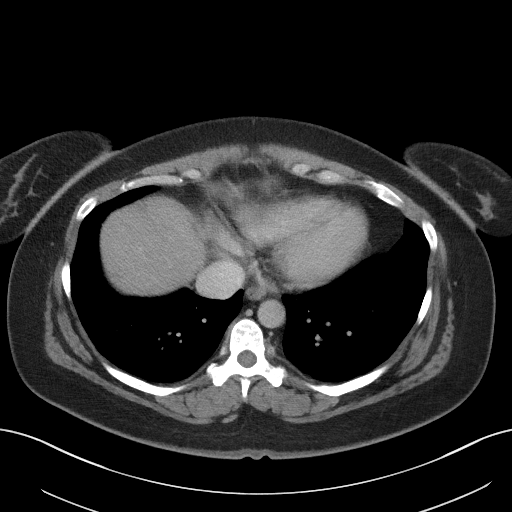

[Series 5: coronal st · coronal · 0.79mm/px · 3 of 101 slices shown]
[im 34/101  soft-tissue]
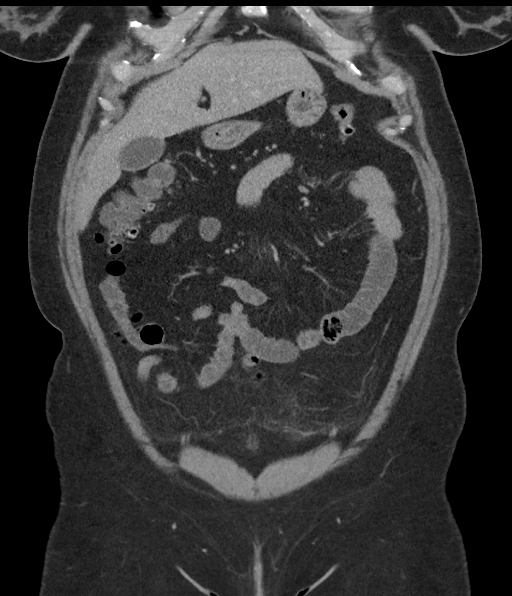
[im 45/101  soft-tissue]
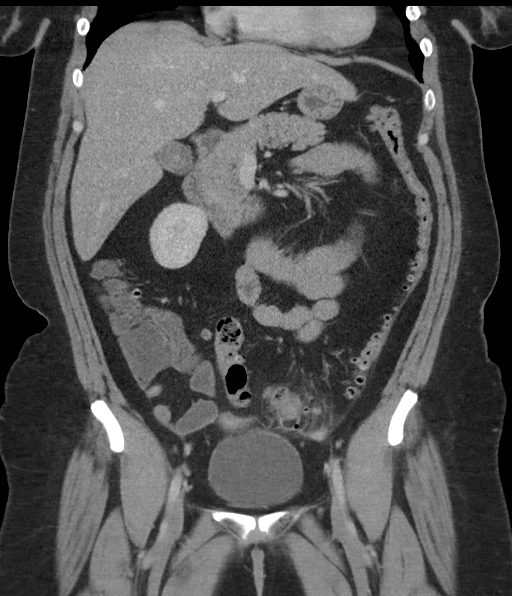
[im 56/101  soft-tissue]
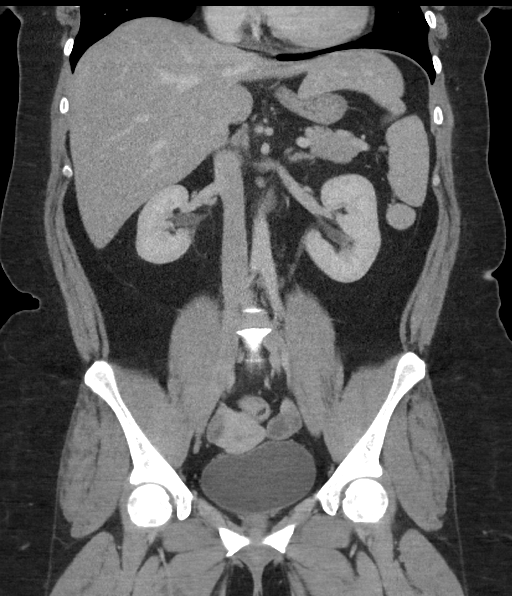

[16 of 46 positions shown; findings below may reference images not displayed]

FINDINGS: Lower chest: Small subpleural nodular areas of ground-glass
attenuation in bilateral lung bases, right greater than left.

Hepatobiliary: No focal liver abnormality is seen. 2 cm gallstone.
No gallbladder wall thickening, or biliary dilatation.

Pancreas: Unremarkable. No pancreatic ductal dilatation or
surrounding inflammatory changes.

Spleen: Normal in size without focal abnormality.

Adrenals/Urinary Tract: Adrenal glands are unremarkable. Kidneys are
normal, without renal calculi, focal lesion, or hydronephrosis.
Bladder is unremarkable.

Stomach/Bowel: Normal appearance of the stomach and small bowel.
Normal appendix. Diffuse colonic diverticulosis. Eccentric mucosal
thickening, and marked pericolonic inflammatory changes surround the
proximal sigmoid colon, likely related to acute diverticulitis. No
evidence of abscess formation.

Vascular/Lymphatic: No significant vascular findings are present. No
enlarged abdominal or pelvic lymph nodes.

Reproductive: Uterus and bilateral adnexa are unremarkable.

Other: No abdominal wall hernia or abnormality. No abdominopelvic
ascites.

Musculoskeletal: No acute or significant osseous findings.
IMPRESSION: 1. Diffuse colonic diverticulosis with evidence of acute
diverticulitis of the proximal sigmoid colon. No evidence of abscess
formation.
2. Cholelithiasis without evidence of acute cholecystitis.
3. Small subpleural nodular areas of ground-glass attenuation in
bilateral lung bases, right greater than left. These may represent
early MHNT0-T2 pneumonia. Consider follow-up after resolution of
patient's symptoms.

## 2021-04-16 ENCOUNTER — Emergency Department (HOSPITAL_BASED_OUTPATIENT_CLINIC_OR_DEPARTMENT_OTHER): Payer: 59

## 2021-04-16 ENCOUNTER — Emergency Department (HOSPITAL_BASED_OUTPATIENT_CLINIC_OR_DEPARTMENT_OTHER)
Admission: EM | Admit: 2021-04-16 | Discharge: 2021-04-16 | Disposition: A | Discharge status: Home / Self Care | Payer: 59 | Attending: Emergency Medicine | Admitting: Emergency Medicine

## 2021-04-16 ENCOUNTER — Encounter (HOSPITAL_BASED_OUTPATIENT_CLINIC_OR_DEPARTMENT_OTHER): Payer: Self-pay

## 2021-04-16 DIAGNOSIS — K8020 Calculus of gallbladder without cholecystitis without obstruction: Secondary | ICD-10-CM | POA: Diagnosis not present

## 2021-04-16 DIAGNOSIS — R109 Unspecified abdominal pain: Secondary | ICD-10-CM

## 2021-04-16 DIAGNOSIS — F17210 Nicotine dependence, cigarettes, uncomplicated: Secondary | ICD-10-CM | POA: Insufficient documentation

## 2021-04-16 LAB — CBC
HCT: 40.4 % (ref 36.0–46.0)
Hemoglobin: 13.1 g/dL (ref 12.0–15.0)
MCH: 27.6 pg (ref 26.0–34.0)
MCHC: 32.4 g/dL (ref 30.0–36.0)
MCV: 85.2 fL (ref 80.0–100.0)
Platelets: 317 10*3/uL (ref 150–400)
RBC: 4.74 MIL/uL (ref 3.87–5.11)
RDW: 14 % (ref 11.5–15.5)
WBC: 11.2 10*3/uL — ABNORMAL HIGH (ref 4.0–10.5)
nRBC: 0 % (ref 0.0–0.2)

## 2021-04-16 LAB — URINALYSIS, ROUTINE W REFLEX MICROSCOPIC
Bilirubin Urine: NEGATIVE
Glucose, UA: NEGATIVE mg/dL
Hgb urine dipstick: NEGATIVE
Ketones, ur: NEGATIVE mg/dL
Leukocytes,Ua: NEGATIVE
Nitrite: NEGATIVE
Protein, ur: NEGATIVE mg/dL
Specific Gravity, Urine: 1.01 (ref 1.005–1.030)
pH: 6 (ref 5.0–8.0)

## 2021-04-16 LAB — COMPREHENSIVE METABOLIC PANEL
ALT: 29 U/L (ref 0–44)
AST: 23 U/L (ref 15–41)
Albumin: 4.2 g/dL (ref 3.5–5.0)
Alkaline Phosphatase: 54 U/L (ref 38–126)
Anion gap: 10 (ref 5–15)
BUN: 15 mg/dL (ref 6–20)
CO2: 23 mmol/L (ref 22–32)
Calcium: 8.8 mg/dL — ABNORMAL LOW (ref 8.9–10.3)
Chloride: 102 mmol/L (ref 98–111)
Creatinine, Ser: 0.7 mg/dL (ref 0.44–1.00)
GFR, Estimated: >60 mL/min (ref >60)
Glucose, Bld: 100 mg/dL — ABNORMAL HIGH (ref 70–99)
Potassium: 3.9 mmol/L (ref 3.5–5.1)
Sodium: 135 mmol/L (ref 135–145)
Total Bilirubin: 0.4 mg/dL (ref 0.3–1.2)
Total Protein: 7.9 g/dL (ref 6.5–8.1)

## 2021-04-16 LAB — TROPONIN I (HIGH SENSITIVITY): Troponin I (High Sensitivity): <2 ng/L (ref <18)

## 2021-04-16 LAB — LIPASE, BLOOD: Lipase: 34 U/L (ref 11–51)

## 2021-04-16 LAB — PREGNANCY, URINE: Preg Test, Ur: NEGATIVE

## 2021-04-16 NOTE — ED Notes (Signed)
Pt ambulated to BR with steady gait.

## 2021-04-16 NOTE — Discharge Instructions (Signed)
Please follow-up with [facility] for further evaluation of your gallstones.  I would also recommend following up with your PCP regarding ED visit today.  I would recommend taking 800 mg ibuprofen if you have pain.  Return to the ED if pain persist for greater than 4 hours.  Return to the ED if you begin developing fevers greater than 100.4 or excessive vomiting with pain as he will need further evaluation at that time.  Attached is additional information on gallstones.  Please read.

## 2021-04-16 NOTE — ED Triage Notes (Signed)
Pt c/o intermittent upper abd pain/back pain x 6 months-NAD-steady gait

## 2021-04-16 NOTE — ED Provider Notes (Signed)
[facility] DEPART[facility] Provider Note   CSN: [id number] Arrival date & time: 04/16/21  1356     History Chief Complaint  Patient presents with   Abdominal Pain    Samantha [name] is a 45 y.o. female who presents to the ED today with complaint of intermittent abdominal pain for the past several months.  She states that over the last week she has had 3 episodes.  She states that she will have a tightness/band across her upper abdomen going into her back.  She states she feels like it takes her breath away.  She is unsure if it is anxiety related.  She states she used to be on Wellbutrin several years ago however decreased some stressors in her life and thought this would help.  She does admit that she has been under quite a lot of stress recently with work as well as family.  She is unsure if her pain is related to food.  She states that she is concerned that it could be her gallbladder.  She denies any nausea or vomiting.  She has not followed up with her PCP for same however does have one to follow-up with.  She denies any fevers or chills.   The history is provided by the patient and medical records.      Past Medical History:  Diagnosis Date   Diverticulitis     Patient Active Problem List   Diagnosis Date Noted   Pneumonia due to COVID-19 virus 05/23/2020   Cough 05/23/2020    History reviewed. No pertinent surgical history.   OB History   No obstetric history on file.     No family history on file.  Social History   Tobacco Use   Smoking status: Some Days    Types: Cigarettes   Smokeless tobacco: Never  Vaping Use   Vaping Use: Never used  Substance Use Topics   Alcohol use: Yes    Comment: occ   Drug use: Never    Home Medications Prior to Admission medications   Not on File    Allergies    Patient has no known allergies.  Review of Systems   Review of Systems  Constitutional:  Negative for chills and fever.  Cardiovascular:   Positive for chest pain.  Gastrointestinal:  Positive for abdominal pain. Negative for diarrhea, nausea and vomiting.  Musculoskeletal:  Positive for back pain.  All other systems reviewed and are negative.  Physical Exam Updated Vital Signs BP 127/62   Pulse 63   Temp 98.4 F (36.9 C)   Resp 11   Ht 5\' 4"  (1.626 m)   Wt 94.8 kg   LMP 04/09/2021   SpO2 100%   BMI 35.87 kg/m   Physical Exam Vitals and nursing note reviewed.  Constitutional:      Appearance: She is not ill-appearing or diaphoretic.  HENT:     Head: Normocephalic and atraumatic.  Eyes:     Conjunctiva/sclera: Conjunctivae normal.  Cardiovascular:     Rate and Rhythm: Normal rate and regular rhythm.     Heart sounds: Normal heart sounds.  Pulmonary:     Effort: Pulmonary effort is normal.     Breath sounds: Normal breath sounds. No wheezing, rhonchi or rales.  Abdominal:     General: Abdomen is flat.     Palpations: Abdomen is soft.     Tenderness: There is abdominal tenderness in the epigastric area.  Musculoskeletal:     Cervical back: Neck supple.  Skin:    General: Skin is warm and dry.  Neurological:     Mental Status: She is alert.    ED Results / Procedures / Treatments   Labs (all labs ordered are listed, but only abnormal results are displayed) Labs Reviewed  COMPREHENSIVE METABOLIC PANEL - Abnormal; Notable for the following components:      Result Value   Glucose, Bld 100 (*)    Calcium 8.8 (*)    All other components within normal limits  CBC - Abnormal; Notable for the following components:   WBC 11.2 (*)    All other components within normal limits  LIPASE, BLOOD  URINALYSIS, ROUTINE W REFLEX MICROSCOPIC  PREGNANCY, URINE  TROPONIN I (HIGH SENSITIVITY)    EKG None  Radiology US Abdomen Limited RUQ (LIVER/GB)  Result Date: 04/16/2021 CLINICAL DATA:  Abdominal pain. EXAM: ULTRASOUND ABDOMEN LIMITED RIGHT UPPER QUADRANT COMPARISON:  CT abdomen pelvis 01/04/2020 FINDINGS:  Gallbladder: Calcified gallstones within the gallbladder lumen. No wall thickening visualized. Question pericholecystic fluid. Adenomyomatosis. No sonographic Murphy sign noted by sonographer. Common bile duct: Diameter: 25mm. Liver: No focal lesion identified. Increased parenchymal echogenicity. Portal vein is patent on color Doppler imaging with normal direction of blood flow towards the liver. Other: None. IMPRESSION: 1. Cholelithiasis with question pericholecystic fluid. No definite acute cholecystitis with no bowel wall thickening or sonographic Murphy sign. 2. Common bile duct measuring at the upper limits of normal. 3. Adenomyomatosis. 4. Hepatic steatosis. Please note limited evaluation for focal hepatic masses in a patient with hepatic steatosis due to decreased penetration of the acoustic ultrasound waves. Electronically Signed   By: Iven Finn M.D.   On: 04/16/2021 17:11    Procedures Procedures   Medications Ordered in ED Medications - No data to display  ED Course  I have reviewed the triage vital signs and the nursing notes.  Pertinent labs & imaging results that were available during my care of the patient were reviewed by me and considered in my medical decision making (see chart for details).    MDM Rules/Calculators/A&P                           45 year old female who presents to the ED today with complaint of intermittent tightness to her upper abdomen going into her back.  On arrival to the ED today vitals are stable.  She had work-up started in the waiting room including CBC, CMP, lipase.  CBC does have a mild elevation of white blood cell count of 11,200.  Hemoglobin stable at 13.1.  CMP with out electrolyte abnormalities.  LFTs unremarkable.  Lipase within normal limits at 34.  UPT negative and UA without signs of infection.  On my exam she is noted to have epigastric abdominal tenderness palpation.  She does admit that she is unsure if this could be related to anxiety  but is also worried that it could be her gallbladder.  Given normal LFTs I have lower suspicion for same however we will plan for right upper quadrant ultrasound for further evaluation.  We will also plan for troponin given complaint of pain and tightness and an EKG.   Troponin < 2. Do not feel pt requires repeat testing at this time Ultrasound: IMPRESSION:  1. Cholelithiasis with question pericholecystic fluid. No definite  acute cholecystitis with no bowel wall thickening or sonographic  Murphy sign.  2. Common bile duct measuring at the upper limits of normal.  3. Adenomyomatosis.  4. Hepatic steatosis. Please note limited evaluation for focal  hepatic masses in a patient with hepatic steatosis due to decreased  penetration of the acoustic ultrasound waves.   Given pt is afebrile without signs of wall thickening or positive Murphy's sign I have lower suspicion for acute cholecystitis at this time.  Will plan to have patient follow-up outpatient with general surgery to discuss likely elective cholecystectomy.  Have discussed food choices with patient.  She is also recommended to follow-up with her PCP.  Patient is in agreement with plan at this time.  She is stable for discharge.  I have instructed her on strict return precautions.   This note was prepared using Dragon voice recognition software and may include unintentional dictation errors due to the inherent limitations of voice recognition software.   Final Clinical Impression(s) / ED Diagnoses Final diagnoses:  Abdominal pain  Calculus of gallbladder without cholecystitis without obstruction    Rx / DC Orders ED Discharge Orders     None        Discharge Instructions      Please follow-up with [facility] for further evaluation of your gallstones.  I would also recommend following up with your PCP regarding ED visit today.  I would recommend taking 800 mg ibuprofen if you have pain.  Return to the ED if  pain persist for greater than 4 hours.  Return to the ED if you begin developing fevers greater than 100.4 or excessive vomiting with pain as he will need further evaluation at that time.  Attached is additional information on gallstones.  Please read.       Eustaquio Maize, PA-C 04/16/21 1726    Drenda Freeze, MD 04/16/21 [phone]

## 2021-12-05 IMAGING — US US ABDOMEN LIMITED
1 series · 14 of 25 positions shown · non-contrast
Comparison: CT abdomen pelvis 01/04/2020

CLINICAL DATA: Abdominal pain.

EXAM:
ULTRASOUND ABDOMEN LIMITED RIGHT UPPER QUADRANT

[Series 1: us abdomen limited · 72 acquisitions, 14 frames shown]
[im 1/72]
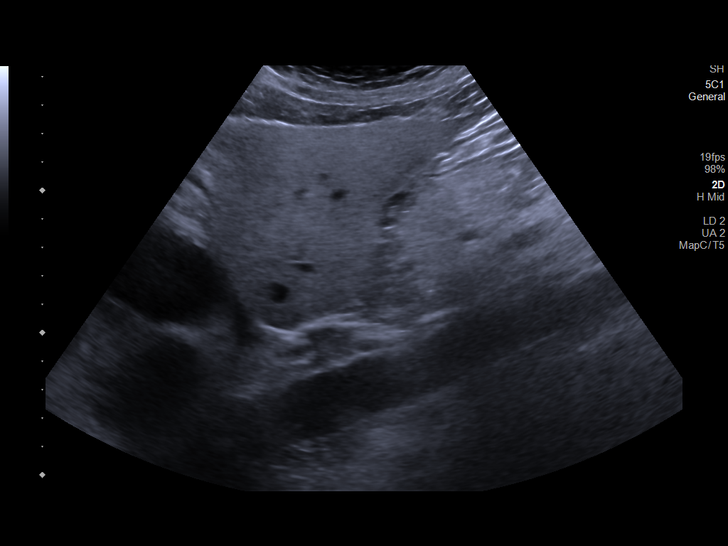
[im 6/72]
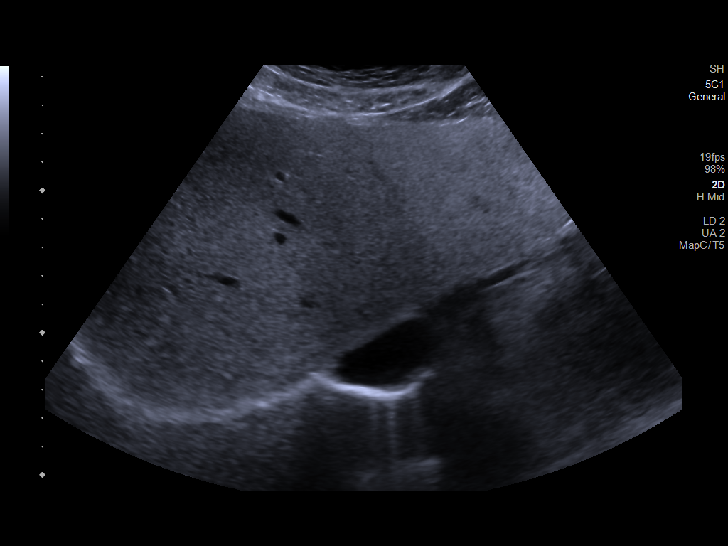
[im 12/72]
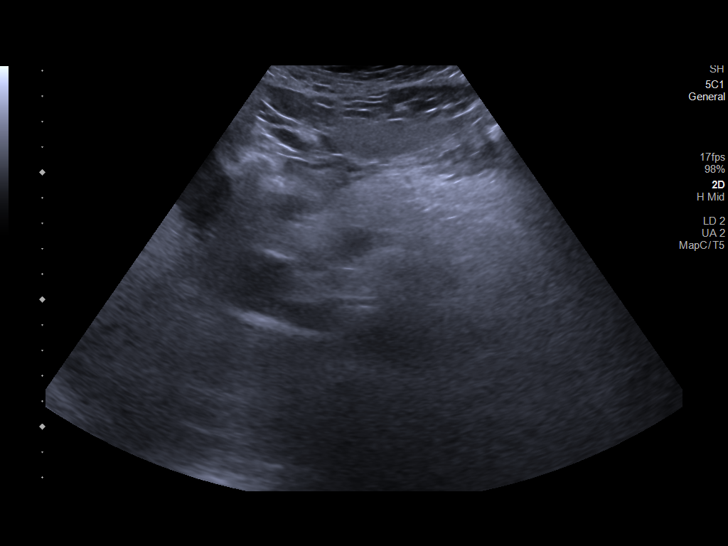
[im 18/72]
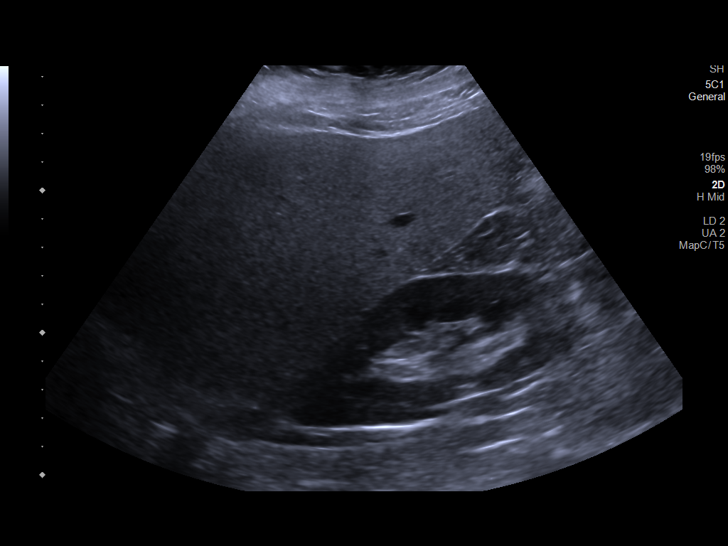
[im 24/72]
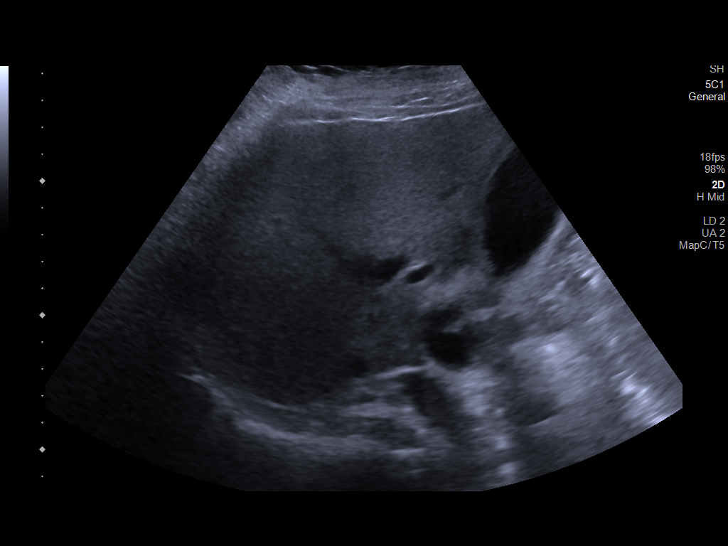
[im 27/72]
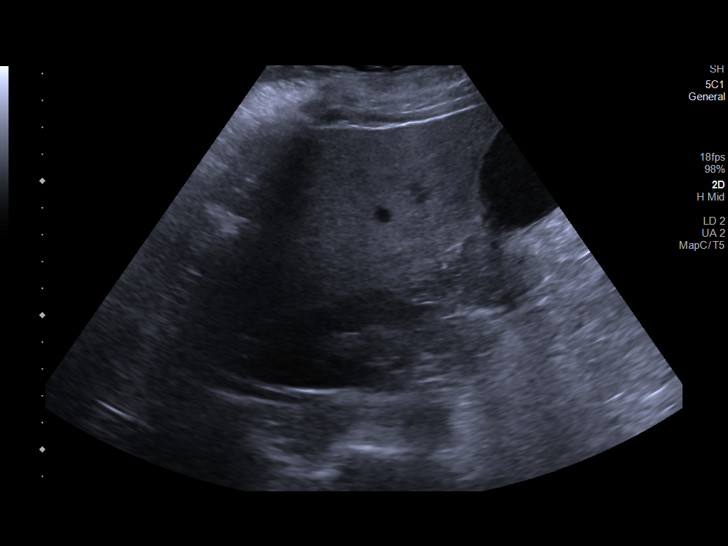
[im 33/72]
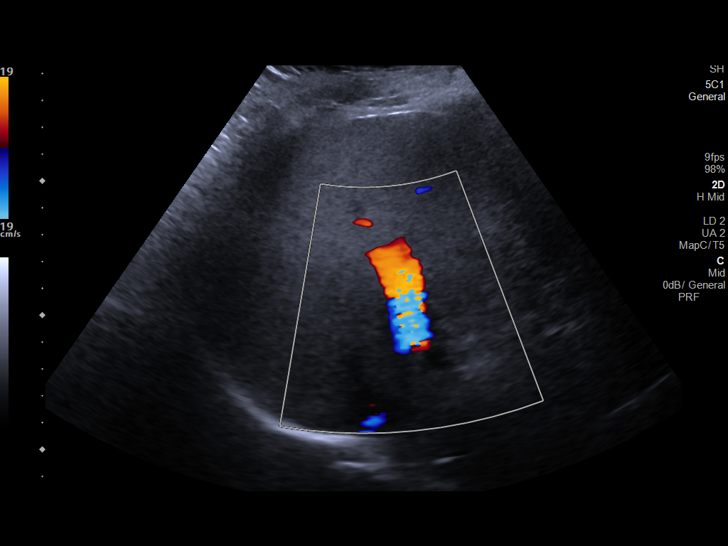
[im 39/72]
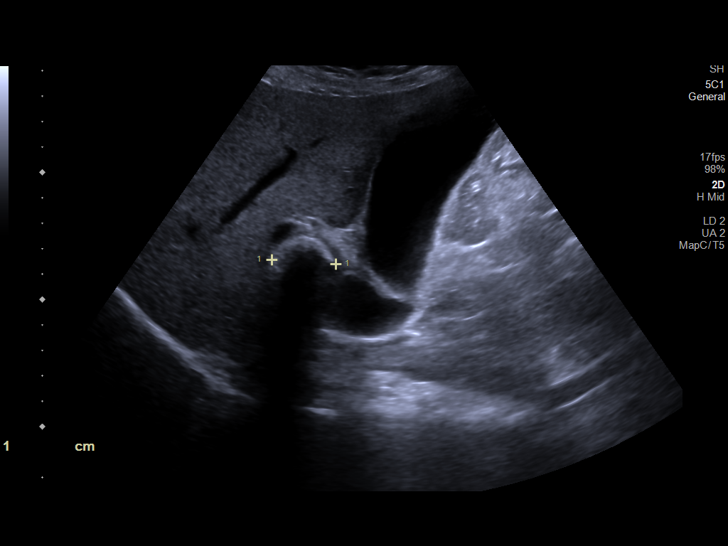
[im 45/72]
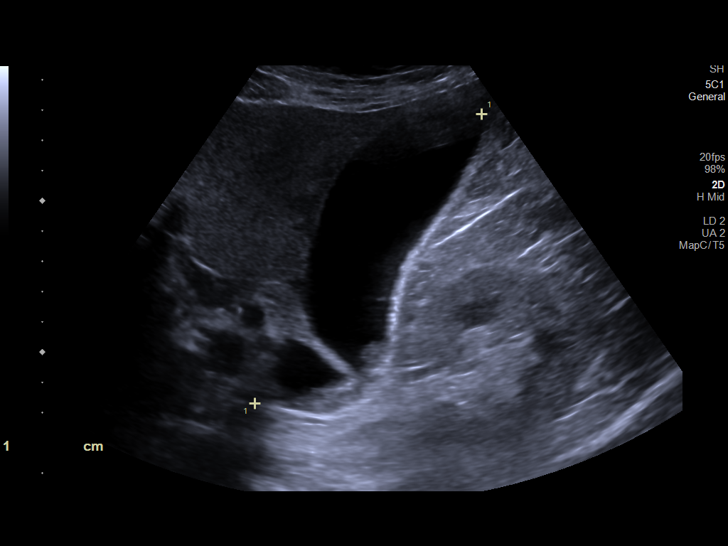
[im 48/72]
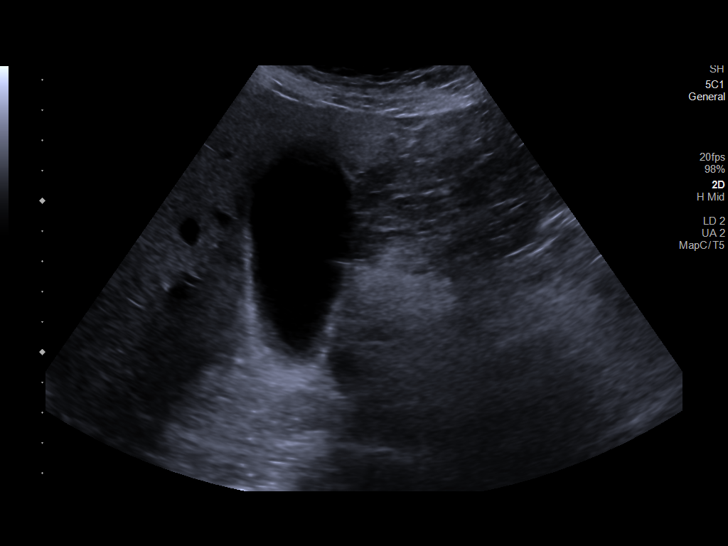
[im 54/72]
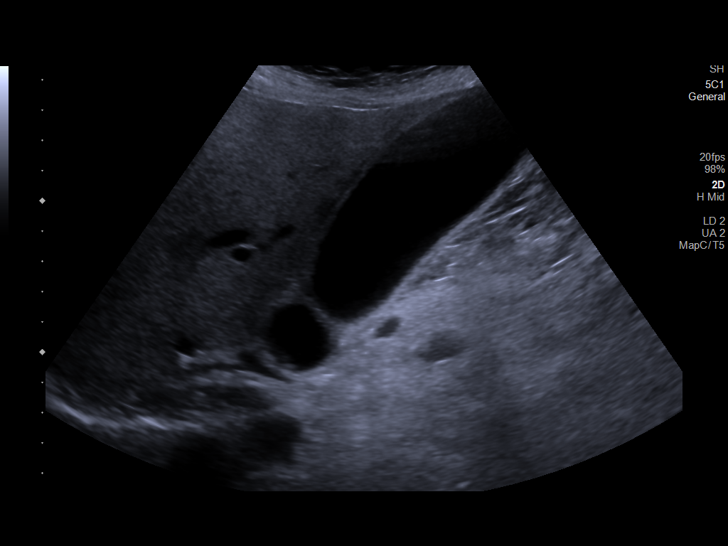
[im 60/72]
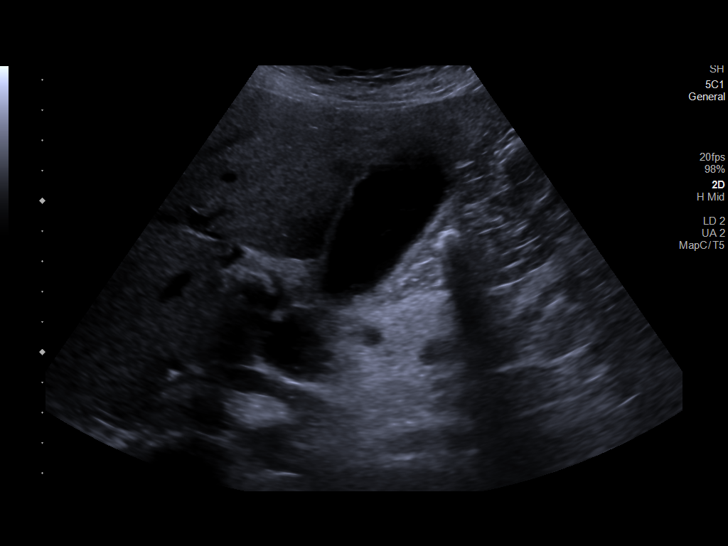
[im 66/72]
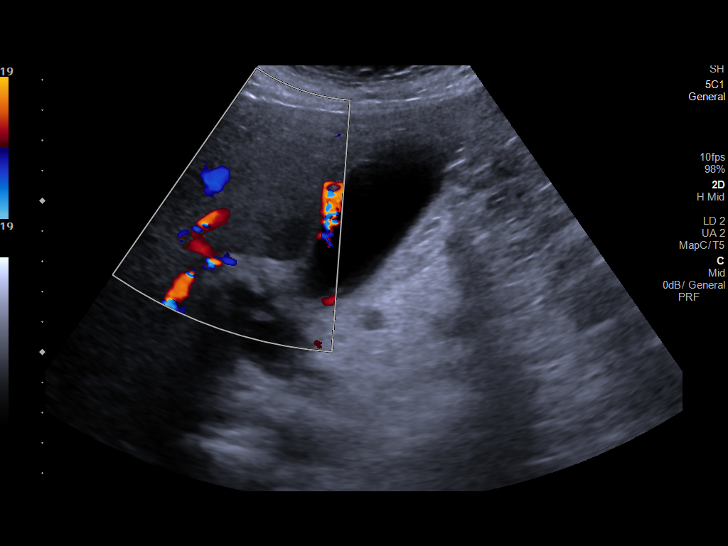
[im 72/72]
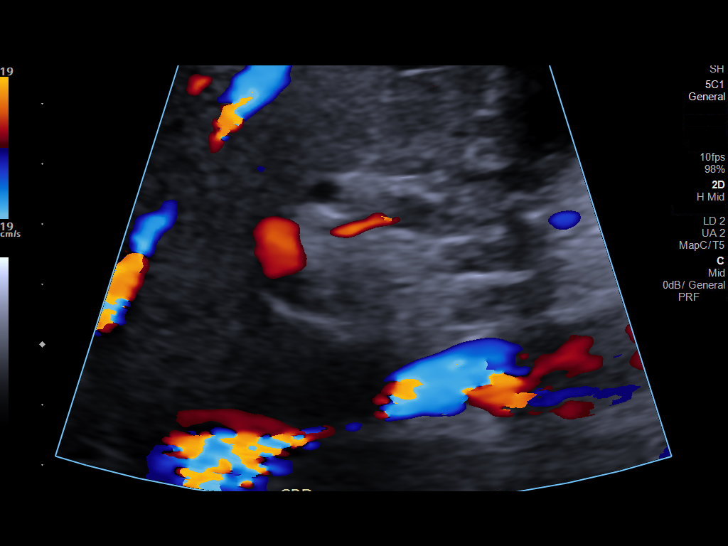

[14 of 25 positions shown; findings below may reference images not displayed]

FINDINGS: Gallbladder:

Calcified gallstones within the gallbladder lumen. No wall
thickening visualized. Question pericholecystic fluid.
Adenomyomatosis. No sonographic Murphy sign noted by sonographer.

Common bile duct:

Diameter: 8mm.

Liver:

No focal lesion identified. Increased parenchymal echogenicity.
Portal vein is patent on color Doppler imaging with normal direction
of blood flow towards the liver.

Other: None.
IMPRESSION: 1. Cholelithiasis with question pericholecystic fluid. No definite
acute cholecystitis with no bowel wall thickening or sonographic
Murphy sign.
2. Common bile duct measuring at the upper limits of normal.
3. Adenomyomatosis.
4. Hepatic steatosis. Please note limited evaluation for focal
hepatic masses in a patient with hepatic steatosis due to decreased
penetration of the acoustic ultrasound waves.

## 2022-12-25 ENCOUNTER — Other Ambulatory Visit (HOSPITAL_BASED_OUTPATIENT_CLINIC_OR_DEPARTMENT_OTHER): Payer: Self-pay | Admitting: Family Medicine

## 2022-12-25 DIAGNOSIS — M545 Low back pain, unspecified: Secondary | ICD-10-CM

## 2023-01-29 ENCOUNTER — Other Ambulatory Visit (HOSPITAL_BASED_OUTPATIENT_CLINIC_OR_DEPARTMENT_OTHER): Payer: Self-pay | Admitting: Family Medicine

## 2023-01-29 ENCOUNTER — Ambulatory Visit (HOSPITAL_BASED_OUTPATIENT_CLINIC_OR_DEPARTMENT_OTHER)
Admission: RE | Admit: 2023-01-29 | Discharge: 2023-01-29 | Disposition: A | Discharge status: Home / Self Care | Payer: 59 | Source: Ambulatory Visit | Attending: Family Medicine | Admitting: Family Medicine

## 2023-01-29 DIAGNOSIS — M545 Low back pain, unspecified: Secondary | ICD-10-CM | POA: Insufficient documentation

## 2023-06-27 ENCOUNTER — Emergency Department (HOSPITAL_BASED_OUTPATIENT_CLINIC_OR_DEPARTMENT_OTHER)
Admission: EM | Admit: 2023-06-27 | Discharge: 2023-06-27 | Disposition: A | Discharge status: Home / Self Care | Payer: 59 | Attending: Emergency Medicine | Admitting: Emergency Medicine

## 2023-06-27 ENCOUNTER — Encounter (HOSPITAL_BASED_OUTPATIENT_CLINIC_OR_DEPARTMENT_OTHER): Payer: Self-pay

## 2023-06-27 ENCOUNTER — Other Ambulatory Visit: Payer: Self-pay

## 2023-06-27 ENCOUNTER — Emergency Department (HOSPITAL_BASED_OUTPATIENT_CLINIC_OR_DEPARTMENT_OTHER): Payer: 59

## 2023-06-27 DIAGNOSIS — M79604 Pain in right leg: Secondary | ICD-10-CM | POA: Insufficient documentation

## 2023-06-27 DIAGNOSIS — M79661 Pain in right lower leg: Secondary | ICD-10-CM

## 2023-06-27 DIAGNOSIS — C50919 Malignant neoplasm of unspecified site of unspecified female breast: Secondary | ICD-10-CM | POA: Insufficient documentation

## 2023-06-27 HISTORY — DX: Malignant (primary) neoplasm, unspecified: C80.1

## 2023-06-27 NOTE — Discharge Instructions (Signed)
You have been seen today for your complaint of calf pain. Your imaging was negative for blood clot. Follow up with: Your primary care provider.  Return to the emergency department if your symptoms return and you are concerned for DVT. Please seek immediate medical care if you develop any of the following symptoms: You have numbness or tingling in the injured area. You lose a lot of strength in the injured area. At this time there does not appear to be the presence of an emergent medical condition, however there is always the potential for conditions to change. Please read and follow the below instructions.  Do not take your medicine if  develop an itchy rash, swelling in your mouth or lips, or difficulty breathing; call 911 and seek immediate emergency medical attention if this occurs.  You may review your lab tests and imaging results in their entirety on your MyChart account.  Please discuss all results of fully with your primary care provider and other specialist at your follow-up visit.  Note: Portions of this text may have been transcribed using voice recognition software. Every effort was made to ensure accuracy; however, inadvertent computerized transcription errors may still be present.

## 2023-06-27 NOTE — ED Provider Notes (Signed)
Samantha [name]   CSN: [id number] Arrival date & time: 06/27/23  5621     History  Chief Complaint  Patient presents with   Leg Pain    Samantha [name] is a 48 y.o. female.  Who goes by the name Samantha [name] with history of breast cancer on tamoxifen presenting to the ED for evaluation of right calf pain.  She says she woke up with pain to the right calf this morning.  States it feels like she stretched too far.  She is concerned.  DVT given that she is on tamoxifen and.  She denies any trauma.  No ankle pain or knee pain or hip pain.  No swelling to the right lower extremity.  No numbness, weakness or tingling.  She received the ultrasound while waiting for a room in the emergency department.  She states that shortly after the ultrasound her symptoms completely resolved and she is asymptomatic now.   Leg Pain      Home Medications Prior to Admission medications   Not on File      Allergies    Patient has no known allergies.    Review of Systems   Review of Systems  All other systems reviewed and are negative.   Physical Exam Updated Vital Signs BP 119/70 (BP Location: Left Arm)   Pulse 69   Temp 97.8 F (36.6 C) (Oral)   Resp 18   Ht 5\' 4"  (1.626 m)   Wt 97.5 kg   SpO2 98%   BMI 36.90 kg/m  Physical Exam Vitals and nursing [name] reviewed.  Constitutional:      General: She is not in acute distress.    Appearance: Normal appearance. She is normal weight. She is not ill-appearing.  HENT:     Head: Normocephalic and atraumatic.  Pulmonary:     Effort: Pulmonary effort is normal. No respiratory distress.  Abdominal:     General: Abdomen is flat.  Musculoskeletal:        General: Normal range of motion.     Cervical back: Neck supple.     Comments: Right lower extremity similar in appearance when compared contralaterally.  No overlying erythema.  No tenderness to palpation of the calf.  Negative Homans' sign.   DP pulses 2+ bilaterally.  Sensation intact in all digits.  Compartments are soft.  Skin:    General: Skin is warm and dry.  Neurological:     Mental Status: She is alert and oriented to person, place, and time.  Psychiatric:        Mood and Affect: Mood normal.        Behavior: Behavior normal.     ED Results / Procedures / Treatments   Labs (all labs ordered are listed, but only abnormal results are displayed) Labs Reviewed - No data to display  EKG None  Radiology US Venous Img Lower Unilateral Right Result Date: 06/27/2023 CLINICAL DATA:  Acute onset right posterior calf pain EXAM: Right LOWER EXTREMITY VENOUS DOPPLER ULTRASOUND TECHNIQUE: Gray-scale sonography with compression, as well as color and duplex ultrasound, were performed to evaluate the deep venous system(s) from the level of the common femoral vein through the popliteal and proximal calf veins. COMPARISON:  None Available. FINDINGS: VENOUS Normal compressibility of the common femoral, superficial femoral, and popliteal veins, as well as the visualized calf veins. Visualized portions of profunda femoral vein and great saphenous vein unremarkable. No filling defects to suggest DVT on grayscale  or color Doppler imaging. Doppler waveforms show normal direction of venous flow, normal respiratory plasticity and response to augmentation. Limited views of the contralateral common femoral vein are unremarkable. OTHER None. Limitations: none IMPRESSION: No evidence of right lower extremity DVT. Electronically Signed   By: Karen Kays M.D.   On: 06/27/2023 10:11    Procedures Procedures    Medications Ordered in ED Medications - No data to display  ED Course/ Medical Decision Making/ A&P                                 Medical Decision Making This patient presents to the ED for concern of right calf pain, this involves an extensive number of treatment options.  The differential diagnosis includes DVT, muscle strain,  compartment syndrome  My initial workup includes DVT study  Additional history obtained from: Nursing notes from this visit.  I ordered imaging studies including DVT study right lower extremity I independently visualized and interpreted imaging which showed negative I agree with the radiologist interpretation  Afebrile, hemodynamically stable.  48 year old female presenting to the ED for evaluation of right calf pain.  Symptoms began shortly after waking up this morning.  She is concerned for DVT given that she is on tamoxifen.  She states her symptoms resolved prior to my assessment.  DVT study negative.  Overall suspect musculoskeletal pain.  Lower suspicion for DVT.  She was encouraged to follow-up with her primary care provider.  She was encouraged to return to the emergency department if her symptoms return.  She was given return precautions.  Stable at discharge.  At this time there does not appear to be any evidence of an acute emergency medical condition and the patient appears stable for discharge with appropriate outpatient follow up. Diagnosis was discussed with patient who verbalizes understanding of care plan and is agreeable to discharge. I have discussed return precautions with patient who verbalizes understanding. Patient encouraged to follow-up with their PCP within 1 week. All questions answered.  [name]: Portions of this report may have been transcribed using voice recognition software. Every effort was made to ensure accuracy; however, inadvertent computerized transcription errors may still be present.         Final Clinical Impression(s) / ED Diagnoses Final diagnoses:  Right calf pain    Rx / DC Orders ED Discharge Orders     None         Michelle Piper, PA-C 06/27/23 1046    Estelle June A, DO 07/01/23 1157

## 2023-06-27 NOTE — ED Triage Notes (Signed)
Pt c/o right calf pain that she felt when she woke up today around 6:30.Pt wants to make sure it is not a blood clot because she is taking Tamoxifen 20 mg.
